# Patient Record
Sex: Male | Born: 1945 | Race: White | Hispanic: No | Smoking: Former smoker
Health system: Southern US, Community
[De-identification: ages and names within clinical notes are randomized; demographics above are authoritative.]

## PROBLEM LIST (undated history)

## (undated) DIAGNOSIS — M1990 Unspecified osteoarthritis, unspecified site: Secondary | ICD-10-CM

## (undated) DIAGNOSIS — K922 Gastrointestinal hemorrhage, unspecified: Secondary | ICD-10-CM

## (undated) DIAGNOSIS — K279 Peptic ulcer, site unspecified, unspecified as acute or chronic, without hemorrhage or perforation: Secondary | ICD-10-CM

## (undated) DIAGNOSIS — R0602 Shortness of breath: Secondary | ICD-10-CM

## (undated) DIAGNOSIS — R600 Localized edema: Secondary | ICD-10-CM

## (undated) DIAGNOSIS — I2510 Atherosclerotic heart disease of native coronary artery without angina pectoris: Secondary | ICD-10-CM

## (undated) DIAGNOSIS — I10 Essential (primary) hypertension: Secondary | ICD-10-CM

## (undated) DIAGNOSIS — G8929 Other chronic pain: Secondary | ICD-10-CM

## (undated) DIAGNOSIS — N179 Acute kidney failure, unspecified: Secondary | ICD-10-CM

## (undated) DIAGNOSIS — Z8719 Personal history of other diseases of the digestive system: Secondary | ICD-10-CM

## (undated) DIAGNOSIS — E785 Hyperlipidemia, unspecified: Secondary | ICD-10-CM

## (undated) HISTORY — DX: Acute kidney failure, unspecified: N17.9

## (undated) HISTORY — PX: OTHER SURGICAL HISTORY: SHX169

## (undated) HISTORY — DX: Atherosclerotic heart disease of native coronary artery without angina pectoris: I25.10

## (undated) HISTORY — PX: FOOT SURGERY: SHX648

---

## 1996-09-01 HISTORY — PX: CORONARY ARTERY BYPASS GRAFT: SHX141

## 2005-01-14 ENCOUNTER — Encounter
Admission: RE | Admit: 2005-01-14 | Discharge: 2005-04-14 | Payer: Self-pay | Admitting: Physical Medicine & Rehabilitation

## 2005-01-15 ENCOUNTER — Ambulatory Visit: Payer: Self-pay | Admitting: Physical Medicine & Rehabilitation

## 2005-02-27 ENCOUNTER — Ambulatory Visit: Payer: Self-pay | Admitting: Physical Medicine & Rehabilitation

## 2005-05-23 ENCOUNTER — Ambulatory Visit: Payer: Self-pay | Admitting: Physical Medicine & Rehabilitation

## 2005-05-23 ENCOUNTER — Encounter
Admission: RE | Admit: 2005-05-23 | Discharge: 2005-08-21 | Payer: Self-pay | Admitting: Physical Medicine & Rehabilitation

## 2005-08-19 ENCOUNTER — Ambulatory Visit: Payer: Self-pay | Admitting: Physical Medicine & Rehabilitation

## 2005-08-19 ENCOUNTER — Encounter
Admission: RE | Admit: 2005-08-19 | Discharge: 2005-11-17 | Payer: Self-pay | Admitting: Physical Medicine & Rehabilitation

## 2005-12-16 ENCOUNTER — Encounter
Admission: RE | Admit: 2005-12-16 | Discharge: 2006-03-16 | Payer: Self-pay | Admitting: Physical Medicine & Rehabilitation

## 2005-12-16 ENCOUNTER — Ambulatory Visit: Payer: Self-pay | Admitting: Physical Medicine & Rehabilitation

## 2006-06-10 ENCOUNTER — Encounter
Admission: RE | Admit: 2006-06-10 | Discharge: 2006-09-08 | Payer: Self-pay | Admitting: Physical Medicine & Rehabilitation

## 2006-06-10 ENCOUNTER — Ambulatory Visit: Payer: Self-pay | Admitting: Physical Medicine & Rehabilitation

## 2010-04-02 ENCOUNTER — Encounter: Payer: Self-pay | Admitting: Cardiology

## 2010-04-06 ENCOUNTER — Observation Stay (HOSPITAL_COMMUNITY): Admission: EM | Admit: 2010-04-06 | Discharge: 2010-04-07 | Payer: Self-pay | Admitting: Cardiology

## 2010-04-06 ENCOUNTER — Encounter: Payer: Self-pay | Admitting: Cardiology

## 2010-04-06 ENCOUNTER — Ambulatory Visit: Payer: Self-pay | Admitting: Cardiovascular Disease

## 2010-04-07 ENCOUNTER — Encounter: Payer: Self-pay | Admitting: Cardiology

## 2010-04-09 ENCOUNTER — Encounter: Payer: Self-pay | Admitting: Cardiology

## 2010-04-09 DIAGNOSIS — R0602 Shortness of breath: Secondary | ICD-10-CM

## 2010-05-01 ENCOUNTER — Telehealth (INDEPENDENT_AMBULATORY_CARE_PROVIDER_SITE_OTHER): Payer: Self-pay | Admitting: *Deleted

## 2010-05-02 ENCOUNTER — Encounter: Payer: Self-pay | Admitting: Cardiology

## 2010-07-04 ENCOUNTER — Encounter: Payer: Self-pay | Admitting: Physician Assistant

## 2010-07-04 DIAGNOSIS — F411 Generalized anxiety disorder: Secondary | ICD-10-CM | POA: Insufficient documentation

## 2010-07-04 DIAGNOSIS — R072 Precordial pain: Secondary | ICD-10-CM

## 2010-07-04 DIAGNOSIS — Z951 Presence of aortocoronary bypass graft: Secondary | ICD-10-CM

## 2010-07-04 DIAGNOSIS — M79609 Pain in unspecified limb: Secondary | ICD-10-CM

## 2010-10-01 NOTE — Miscellaneous (Signed)
Summary: Orders Update  Clinical Lists Changes  Problems: Added new problem of CHEST PAIN, PRECORDIAL (ICD-786.51) Added new problem of SHORTNESS OF BREATH (ICD-786.05) Orders: Added new Referral order of Nuclear Med (Nuc Med) - Signed

## 2010-10-01 NOTE — Letter (Signed)
Summary: Appointment -missed  [facility] [address]   [place], [place] [address]   Phone: [phone]  Fax: [phone]     July 04, 2010 MRN: [id number]     Jay Mclean [address] [place], [place]  [address]     Dear Mr. BASILIO,  Our records indicate you missed your appointment on July 04, 2010                        with Gene Dedric Ethington.   It is very important that we reach you to reschedule this appointment. We look forward to participating in your health care needs.   Please contact us at the number listed above at your earliest convenience to reschedule this appointment.   Sincerely,    Glass blower/designer

## 2010-10-01 NOTE — Progress Notes (Signed)
Summary: PHONE: APPT VERIF.  Phone Note Outgoing Call Call back at [facility] Phone [phone] Call back at CALLED PATIENT   Call placed by: Zachary George,  May 01, 2010 3:42 PM Reason for Call: Confirm/change Appt Details for Reason: verify appt /stress test Summary of Call: called Jay Mclean to verify appt for 05-02-2010 and to see why patient did not go for his stress test that was ordered. States noone told him. Please see all documentation in EMR. I spoke directly with the patient on the phone and verified with him the date and time of his test. I also mailed a copy of the order along with the instructions of his test.  Initial call taken by: Zachary George,  May 01, 2010 3:44 PM

## 2010-10-01 NOTE — Letter (Signed)
Summary: Appointment -missed  [facility] at [facility] [address]   [place], [place] [address]   Phone: [phone]  Fax: [phone]     May 02, 2010 MRN: [id number]     TRYSON LUMLEY [address] [place], [place]  [address]     Dear Mr. LANDRIGAN,  Our records indicate you missed your appointment on May 02, 2010                        with Dr.  Antoine Poche.   It is very important that we reach you to reschedule this appointment. We look forward to participating in your health care needs.   Please contact us at the number listed above at your earliest convenience to reschedule this appointment.   Sincerely,    Glass blower/designer

## 2010-10-01 NOTE — Miscellaneous (Signed)
Summary: EXERCISE MYOVIEW  Clinical Lists Changes  Orders: Added new Referral order of Nuclear Med (Nuc Med) - Signed

## 2010-11-15 LAB — CBC
MCHC: 33.9 g/dL (ref 30.0–36.0)
MCV: 98.6 fL (ref 78.0–100.0)
RDW: 14.7 % (ref 11.5–15.5)
WBC: 6 10*3/uL (ref 4.0–10.5)

## 2010-11-15 LAB — CK TOTAL AND CKMB (NOT AT ARMC)
CK, MB: 0.6 ng/mL (ref 0.3–4.0)
CK, MB: 0.8 ng/mL (ref 0.3–4.0)
Relative Index: INVALID (ref 0.0–2.5)
Total CK: 50 U/L (ref 7–232)
Total CK: 51 U/L (ref 7–232)
Total CK: 53 U/L (ref 7–232)

## 2010-11-15 LAB — COMPREHENSIVE METABOLIC PANEL
AST: 32 U/L (ref 0–37)
BUN: 7 mg/dL (ref 6–23)
Chloride: 110 mEq/L (ref 96–112)
Creatinine, Ser: 1.12 mg/dL (ref 0.4–1.5)
GFR calc Af Amer: >60 mL/min (ref >60)
GFR calc non Af Amer: >60 mL/min (ref >60)
Total Bilirubin: 1.1 mg/dL (ref 0.3–1.2)
Total Protein: 7.1 g/dL (ref 6.0–8.3)

## 2010-11-15 LAB — DIFFERENTIAL
Basophils Relative: 1 % (ref 0–1)
Eosinophils Relative: 4 % (ref 0–5)
Lymphocytes Relative: 32 % (ref 12–46)
Monocytes Absolute: 0.4 10*3/uL (ref 0.1–1.0)

## 2010-11-15 LAB — PROTIME-INR
INR: 1.23 (ref 0.00–1.49)
Prothrombin Time: 15.7 seconds — ABNORMAL HIGH (ref 11.6–15.2)

## 2010-11-15 LAB — LIPID PANEL
HDL: 45 mg/dL (ref >39)
Total CHOL/HDL Ratio: 3.3 RATIO

## 2010-11-15 LAB — CARDIAC PANEL(CRET KIN+CKTOT+MB+TROPI): Total CK: 51 U/L (ref 7–232)

## 2010-11-15 LAB — TROPONIN I: Troponin I: <0.01 ng/mL (ref 0.00–0.06)

## 2011-01-17 NOTE — Assessment & Plan Note (Signed)
Mr. Seddon returns today for followup evaluation.  He reported that he  has developed some joint pain, specifically of his knees and elbows.  He has  gone to the emergency room twice now.  The first was on May 06, 2006  and he was prescribed Naproxen and hydrocodone.  We had been already giving  him tramadol and that had been helping him with his pain which I was seeing  him for initially.   The patient went back June 10, 2006 for left elbow pain.  He was told he  has gout and was prescribed colchicine 0.6 mg.  That was started just  yesterday.  He has taken approximately 3-4 tablets and developed severe  nausea along with cold sweats and hoarsness.  He is not continuing to take  that medication.  In the meantime the patient has tried some Darvocet that  his wife had available.  He reports that he takes approximately 4 tablets  per day and that helps all of his joint pain.  He would like to use the  Darvocet in place of his tramadol and possibly use a different medicine for  his gout if one is available.   MEDICATION:  1. Tramadol (not using at present).  2. Darvocet (wife's medicine).   REVIEW OF SYSTEMS:  Noncontributory.   PHYSICAL EXAMINATION:  Well-appearing, moderately overweight adult male who  is pleasant, cooperative.  Blood pressure is 154/75 with pulse 62, respirations 16, and O2 saturation  96% on room air.  His is 5/5 strength bilateral upper and lower extremities.  __________  normal.  Reflexes are 2+ and symmetrical.   IMPRESSION:  1. 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  2. Spontaneous onset of bilateral knee and bilateral elbow pain secondary      to gout/arthritis.  3. In the office today we did have the patient discontinue his tramadol.      In place we started him on Darvocet N 100, 1 to 2 tablets p.o. b.i.d.      with a total of 120 were prescribed.  4. In place of his colchicine we are going to try allopurinol at  100 mg      daily.  I told him to try that medicine and see if it also causes      nausea as the colchicine does.  I would expect that he would tolerate      the allopurinol better than he did the colchicine.  In the meantime we      will plan on seeing the patient in followup in approximately 3 months      time.           ______________________________  Ellwood Dense, M.D.     DC/MedQ  D:  06/11/2006 09:21:25  T:  06/12/2006 14:59:07  Job #:  [id number]

## 2011-01-17 NOTE — Assessment & Plan Note (Signed)
SUBJECTIVE:  [name] returns to the clinic for follow up evaluation.  He  was first seen in this office Jan 15, 2005, for referral from [facility] Department for treatment of bilateral calcaneal pain.   During the last clinic visit, we had this patient start Tramadol 50 mg one  tablet p.o. q.i.d.  He reports that that is helping substantially with the  bilateral calcaneal pain.  He reports that he is now pain free for the first  time in 25 years.  He reports that he can even go off stairs pushing off on  his toes and has no heel pain whatsoever.  He also has returned to playing  golf and does a fair amount of yard work around his home on a regular basis.  He does report slight constipation with the Tramadol, but has used prune  juice and that problem is resolved.  He does need a refill on his Tramadol  in the office today.   MEDICATIONS:  1.  Tramadol 50 mg one tablet p.o. q.i.d.  2.  Lipitor 20 mg p.o. q.h.s.   OBJECTIVE:  GENERAL APPEARANCE:  Well-appearing, moderately overweight adult  male in no acute distress.  VITAL SIGNS:  Blood pressure and vitals were not obtained in the office  today.  EXTREMITIES:  The patient has no complaints whatsoever of his calcaneal  areas bilaterally.  He has 5/5 strength throughout the bilateral lower  extremities.   ASSESSMENT:  Chronic bilateral calcaneal pain after prior fractures in 1980.   PLAN:  The patient has had an excellent response to Tramadol.  We have  refilled his Tramadol at 50 mg one tablet p.o. q.i.d. p.r.n. with three  refills.  We will plan on seeing the patient in follow up in this office in  approximately three months time with refills to his medication prior to that  appointment if necessary.       DC/MedQ  D:  02/28/2005 10:41:23  T:  02/28/2005 11:13:11  Job #:  [id number]

## 2011-01-17 NOTE — Group Therapy Note (Signed)
MEDICAL RECORD NUMBER:  [id number].   REFERRAL:  [facility] Department.   PURPOSE OF EVALUATION:  Evaluate and treat bilateral calcaneal pain.   HISTORY OF PRESENT ILLNESS:  Jay Mclean is a 65 year old adult male  referred to this hospital by [facility] Department for chronic  calcaneal pain.   Jay Mclean has a history of calcaneal fractures bilaterally when he fell  off a roof in 1980. He reports that a pin was placed on his right ankle in  Jay calcaneal region and that was then casted. A cast was also placed on Jay  left side but no pin was placed.   Jay Mclean reports that subsequent to this Jay casts were removed, but he  had persistent bilateral heel pain through Jay 1980s.   Jay Mclean reports that was tolerable at that time, and he was using Advil  and aspirin. He reports that Jay pain eventually became unbearable in Jay  early 1990s, and he say Dr. Shona Simpson. He subsequently underwent left  subtalar fusion in 1994.   Postoperatively, Jay Mclean reports that his left heel pain still stayed  Jay same. He reports that his right heel pain was unchanged and severe. Jay  Mclean reports that he at one time was given OxyContin, but that medication  was too strong, and it caused some disorientation. He subsequently saw Dr.  Shona Simpson in 1995, and they suggested retirement plan. He was told to limit  activity at that time.   Jay Mclean has seen other orthopedists who have tried shoe inserts along  with over-Jay-counter pain medications. These pain medicines gave him  reasonably relief, but he then developed upper GI bleed in November of 2004  secondary to use of aspirin and anti-inflammatory medication. He reports  that he did use Darvocet for a very short period of time after his bypass  surgery, and that did help him to some small degree. He has used Tylenol up  to 1,500 mg three times a day, but that along with his cholesterol  medication was  felt to be dangerous in terms of his liver function, and Jay  Tylenol has subsequently been discontinued.   Jay Mclean reports that he wants to be active as possible including  golfing, using a motorized cart, and also doing some woodwork in his garage.  He reports that Jay pain has been present and that Jay only way he gets  relief is if he rests. He tries to limit his activity, and his pain level is  a 3 or 4. If he is standing for two hours or more, then he usually has  increased pain for Jay next day or so. He does not get much relief with any  of Jay medications that he is using to date. In fact, he is on no pain  medications at this point.   PAST MEDICAL HISTORY:  1.  Hyperlipidemia.  2.  Cardiac bypass in 1998.  3.  Upper GI bleed November 2004 secondary to NSAIDs and aspirin.  4.  Hypertension.  5.  History of bilateral calcaneal fractures as above.  6.  Stomach hernia surgery in Jay past.   ALLERGIES:  Intolerance of NSAIDS secondary to GI bleed.   SOCIAL HISTORY:  Jay Mclean is married with three grown children. He is  retired on SSI at Jay present time. He denies alcohol or tobacco usage. He  golfs and does woodworking along with playing with a radio-controlled plane  at Jay present time.  REVIEW OF SYSTEMS:  Positive for night sweats, limb swelling, and shortness  of breath.   MEDICATIONS:  1.  Lipitor 40 mg 1/2 tablet q.d.  2.  Benazepril 10 mg 1 tablet q.d.   PHYSICAL EXAMINATION:  Reasonably well appearing, slightly overweight adult  male in no acute discomfort. Blood pressure 121/69 with a pulse of 94,  respiratory rate 16, and O2 saturation 96% on room air. He ambulates without  any assistive device. Upper extremity and lower extremity strength was 5/5  throughout. Bulk and tone were normal, and reflexes were 2+ and symmetrical.  Sensation was intact to light touch throughout Jay bilateral upper and lower  extremities.   Examination of his bilateral heels  showed that he had no pain with pressure  applied by myself. He has well healed scar present in Jay left lower  extremity from Jay prior subtalar fusion. Skin was intact with no lesions  noted. He has decreased range of motion of Jay bilateral ankles especially  on Jay left. Ankle dorsi flexion was 4+/5 bilaterally.   IMPRESSION:  Chronic bilateral calcaneal pain after prior fractures in Jay  1980s.   At Jay present time, we have evaluated Jay Mclean and found that he  complains of bilateral calcaneal pain for at least 15 to 16 years duration.  Jay subtalar fusion performed in 1994 was unsuccessful in relieving much of  his ankle or heel pain on Jay left side. He has tried on OxyContin in Jay  past, but that medication was too strong. He has been unable to tolerate Jay  NSAIDs or Tylenol secondary fear of liver function abnormalities along with  Jay upper GI bleed noted.   At this point, we will try Ultram (tramadol) at 50 mg 1 tablet p.o. q.i.d.  p.r.n. I have given him a prescription for 60 in Jay office today so that we  do not waste medication if he is unable to tolerate Jay medication or does  not get adequate relief. He will continue to monitor and modify his activity  to decrease Jay onset of pain which occasionally occurs after standing or  weight bearing for two hours or so. We will plan on seeing Jay Mclean in  followup in approximately one month's time. We may still consider use of  Darvocet which he has tolerated in Jay past. We will have to still consider  Jay potential over use of Jay Darvocet with Jay Tylenol present in this  combination medication.   We will plan on seeing Jay Mclean in followup in approximately one month's  time.      DC/MedQ  D:  01/15/2005 14:39:25  T:  01/15/2005 15:17:18  Job #:  [id number]

## 2011-01-17 NOTE — Assessment & Plan Note (Signed)
DATE OF EVALUATION:  December 17, 2005.   SUBJECTIVE:  [name] returns to clinic today for follow up evaluation.  He has good news all around. He has lost a total of 50 pounds and still  plans to get down to 185 pounds.  He has been able to come off his blood  pressure and cholesterol medications, specifically his Benazepril and  Lipitor secondary to the weight loss resulting from improved pain control.  He does take his Tramidol 50 mg q.i.d. and reports that it gives him  excellent relief and he has only minimal pain in his heels.  He continues to  be followed by Dr. Georgann Housekeeper, his primary care physician.  He continues  to be able to play 18 holes of golf without using a cart.  He also is able  to walk on a treadmill regularly.   MEDICATIONS:  Tramidol 50 mg one tablet q.i.d. p.r.n. (4 per day).   REVIEW OF SYSTEMS:  Positive for limb swelling periodically.   PHYSICAL EXAMINATION:  GENERAL APPEARANCE:  Well-appearing, casually dressed  adult male in no acute distress.  VITAL SIGNS:  Blood pressure 132/75 with pulse 79, respiratory rate 16 and  oxygen saturation 97% on room air.  MUSCULOSKELETAL:  He has 5/5 strength throughout the bilateral upper and  lower extremities.  Bulk and tone were normal.  Reflexes were 2+ and  symmetrical.   IMPRESSION:  Chronic bilateral calcaneal pain after prior fractures in the  1980's.   PLAN:  We refilled the patient's Tramidol just recently and he still has  refills on that prescription.  He is doing extremely well overall and  continues to improve his health substantially.  He is only taking Tramidol  and has been able to come off blood pressure and cholesterol medications.  His weight continues to improve as he becomes more active.  Will plan on  seeing the patient in follow up in this office in approximately six months  time with refill of medications prior to that appointment as necessary.           ______________________________  Ellwood Dense, M.D.     DC/MedQ  D:  12/17/2005 14:14:41  T:  12/18/2005 09:59:31  Job #:  [id number]

## 2011-01-17 NOTE — Assessment & Plan Note (Signed)
MEDICAL RECORD NUMBER:  [id number].   [name] returns to clinic today for followup evaluation. I saw him in  this office February 28, 2005. We have been treating him with tramadol 50 mg 4  times per day for a bilateral calcaneal pain. He had history of prior  fractures in the 1980s. He reports that he is getting good results with the  q.i.d. tramadol at the present time. He reports that he has no pain if he  walks 9 holes of golf but has only temporary pain if he walks 18 holes of  golf. 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. He reports  that with the increased pain relief he has been able to walk more and has  actually lost approximately 40 pounds such that he does not need to use his  cholesterol or blood pressure medicines. He is using only tramadol at this  time.   MEDICATIONS:  Tramadol 50 mg 1 tablet q.i.d. p.r.n. (4 per day).   REVIEW OF SYSTEMS:  Positive for weight loss intentionally, limb swelling,  and poor appetite.   PHYSICAL EXAMINATION:  Well-appearing, pleasant adult male in no acute  discomfort. Blood pressure 137/67 with a pulse of 74, respiratory rate 16,  and O2 saturation 97% on room air. He has 5-/5 strength throughout the  bilateral upper and lower extremities. Bulk and tone were normal, and  reflexes were 2+ and symmetrical.   IMPRESSION:  Chronic bilateral calcaneal pain after prior fractures in 1980.   In this office today, no refill on his tramadol is necessary. He has one  refill remaining from his prior prescription. He is using the tramadol 4  times per day without adverse side effects or complications. We will plan on  seeing the patient in followup in this office in approximately three months'  time with refills of medications prior to that appointment as necessary.           ______________________________  Ellwood Dense, M.D.     DC/MedQ  D:  05/26/2005 09:43:50  T:  05/27/2005 54:09:81  Job #:  [id number]

## 2011-01-17 NOTE — Assessment & Plan Note (Signed)
MEDICAL RECORD NUMBER:  [id number].   Mr. Zimbelman returns to the clinic today for followup evaluation. He is  thrilled with the improvement in his pain level that has persisted with the  use of the tramadol at 50 mg 4 times per day. He has been able to loose 50  pounds over the last several months and also has been able to stop  cholesterol and blood pressure medicines. His blood pressure is within  normal limits in the office today. He reports that he occasionally has a  swollen left ankle, but that is after prolonged standing or walking. He is  trying to play golf and walking on a daily basis. His goal is to loose  another 30 pounds.   The patient did have recent x-rays which were reportedly okay with no sign  of gout. He does not need a refill on his tramadol in the office today.   MEDICATIONS:  Tramadol 50 mg 1 tablet q.i.d. p.r.n. (4 per day).   REVIEW OF SYSTEMS:  Positive for limb swelling in the left lower extremity.   PHYSICAL EXAMINATION:  Well appearing, casually dressed adult male in no  acute discomfort. Blood pressure 127/67 with a pulse of 77, respiratory rate  16, and O2 saturation 98% on room air. He has 5-/5 strength throughout the  bilateral upper and lower extremities. Bulk and tone were normal, and  reflexes were 2+ and symmetrical. Sensation was intact to light touch  throughout.   IMPRESSION:  Chronic bilateral calcaneal pain after prior fractures in the  1980s.   No refill on his tramadol is necessary. He has made excellent progress, and  his pain relief is such that he is able to walk on a daily basis and has  lost 50 pounds. He has still has goal of another 30 pounds to loose. He is  doing well overall with steady improvement in his health as evidenced by the  discontinuation of his cholesterol and blood pressure medicines. He reports  that his recent total cholesterol was measured at 127.   We will plan on seeing the patient in followup in this office  in  approximately four months' time with refill of medications prior to that  appointment as necessary.           ______________________________  Ellwood Dense, M.D.     DC/MedQ  D:  08/20/2005 15:56:40  T:  08/22/2005 09:42:15  Job #:  [id number]

## 2012-04-12 ENCOUNTER — Encounter (HOSPITAL_COMMUNITY): Payer: Self-pay | Admitting: Emergency Medicine

## 2012-04-12 ENCOUNTER — Emergency Department (HOSPITAL_COMMUNITY): Payer: Medicare Other

## 2012-04-12 ENCOUNTER — Emergency Department (HOSPITAL_COMMUNITY)
Admission: EM | Admit: 2012-04-12 | Discharge: 2012-04-12 | Disposition: A | Discharge status: Home / Self Care | Payer: Medicare Other | Attending: Emergency Medicine | Admitting: Emergency Medicine

## 2012-04-12 DIAGNOSIS — W19XXXA Unspecified fall, initial encounter: Secondary | ICD-10-CM | POA: Insufficient documentation

## 2012-04-12 DIAGNOSIS — I10 Essential (primary) hypertension: Secondary | ICD-10-CM | POA: Insufficient documentation

## 2012-04-12 DIAGNOSIS — M545 Low back pain: Secondary | ICD-10-CM

## 2012-04-12 DIAGNOSIS — I252 Old myocardial infarction: Secondary | ICD-10-CM | POA: Insufficient documentation

## 2012-04-12 DIAGNOSIS — Y9301 Activity, walking, marching and hiking: Secondary | ICD-10-CM | POA: Insufficient documentation

## 2012-04-12 DIAGNOSIS — S32000A Wedge compression fracture of unspecified lumbar vertebra, initial encounter for closed fracture: Secondary | ICD-10-CM

## 2012-04-12 DIAGNOSIS — S32009A Unspecified fracture of unspecified lumbar vertebra, initial encounter for closed fracture: Secondary | ICD-10-CM | POA: Insufficient documentation

## 2012-04-12 DIAGNOSIS — R6889 Other general symptoms and signs: Secondary | ICD-10-CM | POA: Insufficient documentation

## 2012-04-12 DIAGNOSIS — Y998 Other external cause status: Secondary | ICD-10-CM

## 2012-04-12 HISTORY — DX: Essential (primary) hypertension: I10

## 2012-04-12 MED ORDER — OXYCODONE-ACETAMINOPHEN 5-325 MG PO TABS: 1.0000 | ORAL_TABLET | Freq: Four times a day (QID) | ORAL | Status: AC | PRN

## 2012-04-12 MED ORDER — OXYCODONE-ACETAMINOPHEN 5-325 MG PO TABS
2.0000 | ORAL_TABLET | Freq: Once | ORAL | Status: AC
  Administered 2012-04-12: 2 via ORAL
  Filled 2012-04-12: qty 2

## 2012-04-12 NOTE — ED Provider Notes (Signed)
History     CSN: [id number]  Arrival date & time 04/12/12  0350   First MD Initiated Contact with Patient 04/12/12 0351      Chief Complaint  Patient presents with  . Fall  . Back Pain     The history is provided by the patient.  patient reports he was watching TV this evening and fell asleep on the couch.  When he went to get up to go to bed he reports falling and landing on his bottom.  He reports severe pain in his low back at this time.  He denies weakness or numbness of his lower extremities.  His no chest pain or abdominal pain.  Said no recent fevers or chills.  He denies dysuria or urinary frequency.  He has no bowel or bladder complaints.  His pain is worse with movement and palpation of his low back.  Nothing improves his pain.  His pain is constant and moderate in severity. He has no history of cancer.  He denies chest pain or shortness of breath.  Past Medical History  Diagnosis Date  . MI (myocardial infarction)   . Hypertension     Past Surgical History  Procedure Date  . Foot surgery   . Coronary artery bypass graft 1998    x3    History reviewed. No pertinent family history.  History  Substance Use Topics  . Smoking status: Never Smoker   . Smokeless tobacco: Not on file  . Alcohol Use: No      Review of Systems  All other systems reviewed and are negative.    Allergies  Aspirin and Ibuprofen  Home Medications   Current Outpatient Rx  Name Route Sig Dispense Refill  . ALPRAZOLAM 1 MG PO TABS Oral Take 1 mg by mouth 4 (four) times daily as needed.    . OXYCODONE-ACETAMINOPHEN 5-325 MG PO TABS Oral Take 1 tablet by mouth every 6 (six) hours as needed for pain. 15 tablet 0    BP 137/62  Pulse 81  Temp 98.1 F (36.7 C) (Oral)  Resp 18  Ht 5\' 8"  (1.727 m)  Wt 152 lb (68.947 kg)  BMI 23.11 kg/m2  SpO2 98%  Physical Exam  Nursing note and vitals reviewed. Constitutional: He is oriented to person, place, and time. He appears well-developed  and well-nourished.  HENT:  Head: Normocephalic and atraumatic.  Eyes: EOM are normal.  Neck: Normal range of motion.       No cervical spine tenderness.  C-spine cleared by Nexus criteria.  Cardiovascular: Normal rate, regular rhythm, normal heart sounds and intact distal pulses.   Pulmonary/Chest: Effort normal and breath sounds normal. No respiratory distress.  Abdominal: Soft. He exhibits no distension. There is no tenderness.  Musculoskeletal: Normal range of motion.       The thoracic tenderness.  Mild upper lumbar spinal tenderness without step-offs.  5 out of 5 strength in bilateral upper lower extremity major muscle groups  Neurological: He is alert and oriented to person, place, and time.  Skin: Skin is warm and dry.  Psychiatric: He has a normal mood and affect. Judgment normal.    ED Course  Procedures (including critical care time)  Labs Reviewed - No data to display Dg Lumbar Spine Complete  04/12/2012  *RADIOLOGY REPORT*  Clinical Data: Back pain.  LUMBAR SPINE - COMPLETE 4+ VIEW  Comparison: 05/14/2010.  Findings: There is a new L1 compression fracture with about 25% loss of vertebral body height.  Unchanged compression fracture of T12.  L2 compression fracture appears similar to prior.  The L3-L5 levels are unchanged compared to prior.  Aortoiliac atherosclerosis. Cholecystectomy clips are present in the right upper quadrant.  Mild dextroconvex curvature with the apex at L1. No spondylolisthesis.  Suboptimal oblique views.  No gross pars defects identified.  IMPRESSION:  1.  Interval L1 superior endplate compression fracture, possibly acute or subacute with about 25% loss of anterior vertebral body height; no radiographic evidence of retropulsion. 2.  Chronic L2 superior endplate compression fracture. Chronic T12 and T11 compression fractures.  Original Report Authenticated By: Andreas Newport, M.D.    I personally reviewed the imaging tests through PACS system  I reviewed  available ER/hospitalization records thought the EMR   1. Lumbar compression fracture   2. Low back pain       MDM  The patient has normal strength in his lower extremities.  Discharge home in good condition.  I suspect this is low back pain associated with a new L1 compression fracture.  Home with pain medicine and PCP followup        Lyanne Co, MD 04/12/12 [phone]

## 2012-04-12 NOTE — ED Notes (Addendum)
Pt got up off the couch tonight and experienced a fall onto hardwood floor, landed on his left bottock and is now complaining of lower back. No obvious neuro deficits to the lower extremities. Pt on backboard at this time. States pain with ROM of back

## 2012-04-28 ENCOUNTER — Other Ambulatory Visit: Payer: Self-pay | Admitting: Family Medicine

## 2012-04-28 DIAGNOSIS — T148XXA Other injury of unspecified body region, initial encounter: Secondary | ICD-10-CM

## 2012-04-29 ENCOUNTER — Ambulatory Visit (HOSPITAL_COMMUNITY)
Admission: RE | Admit: 2012-04-29 | Discharge: 2012-04-29 | Disposition: A | Discharge status: Home / Self Care | Payer: Medicare Other | Source: Ambulatory Visit | Attending: Family Medicine | Admitting: Family Medicine

## 2012-04-29 ENCOUNTER — Other Ambulatory Visit: Payer: Self-pay | Admitting: Family Medicine

## 2012-04-29 DIAGNOSIS — IMO0002 Reserved for concepts with insufficient information to code with codable children: Secondary | ICD-10-CM

## 2012-04-29 DIAGNOSIS — M545 Low back pain, unspecified: Secondary | ICD-10-CM | POA: Insufficient documentation

## 2012-04-29 DIAGNOSIS — M5124 Other intervertebral disc displacement, thoracic region: Secondary | ICD-10-CM | POA: Insufficient documentation

## 2012-04-29 DIAGNOSIS — M5126 Other intervertebral disc displacement, lumbar region: Secondary | ICD-10-CM | POA: Insufficient documentation

## 2012-04-29 DIAGNOSIS — T148XXA Other injury of unspecified body region, initial encounter: Secondary | ICD-10-CM

## 2012-04-29 DIAGNOSIS — W19XXXA Unspecified fall, initial encounter: Secondary | ICD-10-CM | POA: Insufficient documentation

## 2012-05-04 ENCOUNTER — Telehealth (HOSPITAL_COMMUNITY): Payer: Self-pay

## 2012-05-04 NOTE — Telephone Encounter (Signed)
I spoke with Jay Mclean in regards to his apt.  I gave him the apt time and date for the consult.  He had a good understanding.

## 2012-05-04 NOTE — Telephone Encounter (Signed)
I left another message for debbie in regards to MR. Reno's apt today.

## 2012-05-05 ENCOUNTER — Ambulatory Visit (HOSPITAL_COMMUNITY): Payer: Medicare Other | Attending: Family Medicine

## 2012-05-05 ENCOUNTER — Telehealth (HOSPITAL_COMMUNITY): Payer: Self-pay

## 2012-05-05 ENCOUNTER — Other Ambulatory Visit (HOSPITAL_COMMUNITY): Payer: Self-pay

## 2012-05-05 NOTE — Telephone Encounter (Signed)
I called the pt.  He stated that it must have slipped his mind.  He also stated that he would call me back to reschedule.

## 2012-05-20 ENCOUNTER — Other Ambulatory Visit: Payer: Self-pay | Admitting: Family Medicine

## 2012-05-20 DIAGNOSIS — IMO0002 Reserved for concepts with insufficient information to code with codable children: Secondary | ICD-10-CM

## 2012-05-27 ENCOUNTER — Ambulatory Visit (HOSPITAL_COMMUNITY): Admission: RE | Admit: 2012-05-27 | Payer: Medicare Other | Source: Ambulatory Visit

## 2012-05-27 ENCOUNTER — Telehealth (HOSPITAL_COMMUNITY): Payer: Self-pay

## 2012-05-27 NOTE — Telephone Encounter (Signed)
[name] did not show for his apt today.  I am calling Jay Mclean to make her aware

## 2012-10-30 ENCOUNTER — Encounter: Payer: Self-pay | Admitting: Cardiovascular Disease

## 2012-10-30 ENCOUNTER — Encounter: Payer: Self-pay | Admitting: Physician Assistant

## 2012-11-16 ENCOUNTER — Other Ambulatory Visit: Payer: Self-pay | Admitting: Physician Assistant

## 2012-11-16 DIAGNOSIS — R195 Other fecal abnormalities: Secondary | ICD-10-CM

## 2012-11-17 NOTE — Addendum Note (Signed)
Addended by: Gwenith Daily on: 11/17/2012 11:45 AM   Modules accepted: Orders

## 2012-11-24 ENCOUNTER — Telehealth: Payer: Self-pay

## 2012-11-24 NOTE — Telephone Encounter (Signed)
Message received.

## 2012-11-24 NOTE — Telephone Encounter (Signed)
Received letter from Dr. Starr Lake office that patient declined appt for heme positive stools

## 2012-12-07 ENCOUNTER — Telehealth: Payer: Self-pay | Admitting: Physician Assistant

## 2012-12-07 ENCOUNTER — Encounter: Payer: Self-pay | Admitting: Cardiovascular Disease

## 2012-12-07 NOTE — Telephone Encounter (Signed)
APPT MADE

## 2012-12-08 ENCOUNTER — Encounter: Payer: Self-pay | Admitting: General Practice

## 2012-12-08 ENCOUNTER — Ambulatory Visit (INDEPENDENT_AMBULATORY_CARE_PROVIDER_SITE_OTHER): Payer: Medicare Other | Admitting: General Practice

## 2012-12-08 VITALS — BP 143/72 | HR 86 | Temp 97.0°F | Ht 68.0 in | Wt 234.0 lb

## 2012-12-08 DIAGNOSIS — J329 Chronic sinusitis, unspecified: Secondary | ICD-10-CM

## 2012-12-08 MED ORDER — AMOXICILLIN 500 MG PO CAPS: 500.0000 mg | ORAL_CAPSULE | Freq: Two times a day (BID) | ORAL | Status: AC

## 2012-12-08 NOTE — Patient Instructions (Signed)

## 2012-12-08 NOTE — Progress Notes (Signed)
  Subjective:    Patient ID: Jay Mclean, male    DOB: [birth year], 67 y.o.   MRN: [id number]  Sinusitis This is a new problem. The current episode started 1 to 4 weeks ago. The problem has been gradually worsening since onset. There has been no fever. His pain is at a severity of 4/10. Associated symptoms include congestion and sinus pressure. Pertinent negatives include no chills, ear pain, headaches or shortness of breath. Past treatments include oral decongestants. The treatment provided no relief.      Review of Systems  Constitutional: Negative for fever and chills.  HENT: Positive for congestion and sinus pressure. Negative for ear pain.   Eyes: Negative for pain, discharge and itching.  Respiratory: Negative for chest tightness and shortness of breath.   Cardiovascular: Negative for chest pain and palpitations.  Gastrointestinal: Negative for abdominal pain and blood in stool.  Genitourinary: Negative for difficulty urinating.  Skin: Negative for rash.  Neurological: Negative for dizziness and headaches.  Psychiatric/Behavioral: Negative.        Objective:   Physical Exam  Constitutional: He is oriented to person, place, and time. He appears well-developed and well-nourished.  HENT:  Head: Normocephalic and atraumatic.  Right Ear: External ear normal.  Left Ear: External ear normal.  Nose: Right sinus exhibits frontal sinus tenderness. Left sinus exhibits frontal sinus tenderness.  Pulmonary/Chest: Effort normal and breath sounds normal. No respiratory distress. He exhibits no tenderness.  Neurological: He is alert and oriented to person, place, and time.  Skin: Skin is warm.  Psychiatric: He has a normal mood and affect.          Assessment & Plan:  Continue antibiotics even if feeling better Continue current medications Proper handwashing RTO if symptoms worsen or unresolved Patient verbalized understanding and denies further questions    Raymon Mutton,  FNP-C

## 2012-12-16 ENCOUNTER — Telehealth: Payer: Self-pay | Admitting: Nurse Practitioner

## 2013-01-03 ENCOUNTER — Telehealth: Payer: Self-pay | Admitting: Nurse Practitioner

## 2013-01-03 ENCOUNTER — Telehealth: Payer: Self-pay | Admitting: General Practice

## 2013-01-04 NOTE — Telephone Encounter (Signed)
Please advise 

## 2013-01-07 NOTE — Telephone Encounter (Signed)
I only seen patient for sinusitis. He would need to be seen for pain issues. thx

## 2013-01-10 NOTE — Telephone Encounter (Signed)
appt made for ck up on depression and pain with new provider to him- [facility]

## 2013-02-01 ENCOUNTER — Ambulatory Visit (INDEPENDENT_AMBULATORY_CARE_PROVIDER_SITE_OTHER): Payer: Medicare Other | Admitting: General Practice

## 2013-02-01 ENCOUNTER — Encounter: Payer: Self-pay | Admitting: General Practice

## 2013-02-01 VITALS — BP 151/77 | HR 96 | Temp 98.2°F | Ht 68.0 in | Wt 234.0 lb

## 2013-02-01 DIAGNOSIS — R0602 Shortness of breath: Secondary | ICD-10-CM

## 2013-02-01 DIAGNOSIS — R609 Edema, unspecified: Secondary | ICD-10-CM

## 2013-02-01 DIAGNOSIS — M25579 Pain in unspecified ankle and joints of unspecified foot: Secondary | ICD-10-CM

## 2013-02-01 DIAGNOSIS — F411 Generalized anxiety disorder: Secondary | ICD-10-CM

## 2013-02-01 DIAGNOSIS — M25572 Pain in left ankle and joints of left foot: Secondary | ICD-10-CM

## 2013-02-01 MED ORDER — HYDROCODONE-ACETAMINOPHEN 5-325 MG PO TABS: 1.0000 | ORAL_TABLET | Freq: Four times a day (QID) | ORAL | Status: DC | PRN

## 2013-02-01 MED ORDER — ALPRAZOLAM 0.25 MG PO TABS: 0.2500 mg | ORAL_TABLET | Freq: Every evening | ORAL | Status: DC | PRN

## 2013-02-01 MED ORDER — HYDROCODONE-ACETAMINOPHEN 5-325 MG PO TABS: 1.0000 | ORAL_TABLET | Freq: Every day | ORAL | Status: DC | PRN

## 2013-02-01 NOTE — Progress Notes (Signed)
  Subjective:    Patient ID: Jay Mclean, male    DOB: 03-Jan-1946, 67 y.o.   MRN: [id number]  HPI Presents today for follow up of chronic health conditions. History of acid reflux, anxiety, and chronic ankle/foot pain. Reports having procedure on left foot/ankle in 1994. Denies being seen by any other ortho specialist since then. Patient reports that he hasn't taken any medications since the end of April or 1st of May.     Review of Systems  Constitutional: Negative for fever and chills.  HENT: Negative for neck pain and neck stiffness.   Eyes: Negative for pain.  Respiratory: Positive for shortness of breath. Negative for chest tightness and wheezing.        Reports being more short of breath over past year.   Cardiovascular: Positive for leg swelling. Negative for chest pain and palpitations.  Gastrointestinal: Negative for abdominal pain and blood in stool.  Genitourinary: Negative for dysuria, hematuria and difficulty urinating.  Musculoskeletal: Negative for myalgias and back pain.  Neurological: Negative for dizziness, weakness, numbness and headaches.  Psychiatric/Behavioral: Negative.        Objective:   Physical Exam  Constitutional: He is oriented to person, place, and time. He appears well-developed and well-nourished.  HENT:  Head: Normocephalic and atraumatic.  Right Ear: External ear normal.  Left Ear: External ear normal.  Mouth/Throat: Oropharynx is clear and moist.  Neck: Normal range of motion.  Cardiovascular: Normal rate, regular rhythm and normal heart sounds.   1+ pitting edema to bilateral lower extremities  Pulmonary/Chest: Effort normal and breath sounds normal. No respiratory distress. He exhibits no tenderness.  Neurological: He is alert and oriented to person, place, and time.  Skin: Skin is warm and dry.  Psychiatric: He has a normal mood and affect.          Assessment & Plan:  1. Pain in joint, ankle and foot, left -  HYDROcodone-acetaminophen (NORCO/VICODIN) 5-325 MG per tablet; Take 1 tablet by mouth daily as needed for pain.  Dispense: 30 tablet; Refill: 0  2. Edema and 3. Shortness of breath - Ambulatory referral to Cardiology -elevate legs while sitting at home -Continue medications as prescribed  4. Generalized Anxiety Disorder -Xanax 0.25mg , one tablet by mouth every night prn, anxiety -Try to determine causes of anxiety  Discussed with patient the goal of decreasing pain and anti-anxiety medications. His vicodin and xanax was decreased during this visit. Patient verbalized understanding and in agreement. He verbalized he felt dizzy when he takes xanax four times daily.  -sedation precaution discussed with patient Patient verbalized understanding Coralie Keens, FNP-C

## 2013-02-01 NOTE — Patient Instructions (Addendum)
Chronic Pain Chronic pain can be defined as pain that is lasting, off and on, and lasts for 3 to 6 months or longer. Many things cause chronic pain, which can make it difficult to make a discrete diagnosis. There are many treatment options available for chronic pain. However, finding a treatment that works well for you may require trying various approaches until a suitable one is found. CAUSES  In some types of chronic medical conditions, the pain is caused by a normal pain response within the body. A normal pain response helps the body identify illness or injury and prevent further damage from being done. In these cases, the cause of the pain may be identified and treated, even if it may not be cured completely. Examples of chronic conditions which can cause chronic pain include:  Inflammation of the joints (arthritis).  Back pain or neck pain (including bulging or herniated disks).  Migraine headaches.  Cancer. In some other types of chronic pain syndromes, the pain is caused by an abnormal pain response within the body. An abnormal pain response is present when there is no ongoing cause (or stimulus) for the pain, or when the cause of the pain is arising from the nerves or nervous system itself. Examples of conditions which can cause chronic pain due to an abnormal pain response include:  Fibromyalgia.  Reflex sympathetic dystrophy (RSD).  Neuropathy (when the nerves themselves are damaged, and may cause pain). DIAGNOSIS  Your caregiver will help diagnose your condition over time. In many cases, the initial focus will be on excluding conditions that could be causing the pain. Depending on your symptoms, your caregiver may order some tests to diagnose your condition. Some of these tests include:  Blood tests.  Computerized X-ray scans (CT scan).  Computerized magnetic scans (MRI).  X-rays.  Ultrasounds.  Nerve conduction studies.  Consultation with other physicians or  specialists. TREATMENT  There are many treatment options for people suffering from chronic pain. Finding a treatment that works well may take time.   You may be referred to a pain management specialist.  You may be put on medication to help with the pain. Unfortunately, some medications (such as opiate medications) may not be very effective in cases where chronic pain is due to abnormal pain responses. Finding the right medications can take some time.  Adjunctive therapies may be used to provide additional relief and improve a patient's quality of life. These therapies include:  Mindfulness meditation.  Acupuncture.  Biofeedback.  Cognitive-behavioral therapy.  In certain cases, surgical interventions may be attempted. HOME CARE INSTRUCTIONS   Make sure you understand these instructions prior to discharge.  Ask any questions and share any further concerns you have with your caregiver prior to discharge.  Take all medications as directed by your caregiver.  Keep all follow-up appointments. SEEK MEDICAL CARE IF:   Your pain gets worse.  You develop a new pain that was not present before.  You cannot tolerate any medications prescribed by your caregiver.  You develop new symptoms since your last visit with your caregiver. SEEK IMMEDIATE MEDICAL CARE IF:   You develop muscular weakness.  You have decreased sensation or numbness.  You lose control of bowel or bladder function.  Your pain suddenly gets much worse.  You have an oral temperature above 102 F (38.9 C), not controlled by medication.  You develop shaking chills, confusion, chest pain, or shortness of breath. Document Released: 05/10/2002 Document Revised: 11/10/2011 Document Reviewed: 08/16/2008 [employer] Patient Information  2014 [place], [place]. Anxiety and Panic Attacks Your caregiver has informed you that you are having an anxiety or panic attack. There may be many forms of this. Most of the time these  attacks come suddenly and without warning. They come at any time of day, including periods of sleep, and at any time of life. They may be strong and unexplained. Although panic attacks are very scary, they are physically harmless. Sometimes the cause of your anxiety is not known. Anxiety is a protective mechanism of the body in its fight or flight mechanism. Most of these perceived danger situations are actually nonphysical situations (such as anxiety over losing a job). CAUSES  The causes of an anxiety or panic attack are many. Panic attacks may occur in otherwise healthy people given a certain set of circumstances. There may be a genetic cause for panic attacks. Some medications may also have anxiety as a side effect. SYMPTOMS  Some of the most common feelings are:  Intense terror.  Dizziness, feeling faint.  Hot and cold flashes.  Fear of going crazy.  Feelings that nothing is real.  Sweating.  Shaking.  Chest pain or a fast heartbeat (palpitations).  Smothering, choking sensations.  Feelings of impending doom and that death is near.  Tingling of extremities, this may be from over-breathing.  Altered reality (derealization).  Being detached from yourself (depersonalization). Several symptoms can be present to make up anxiety or panic attacks. DIAGNOSIS  The evaluation by your caregiver will depend on the type of symptoms you are experiencing. The diagnosis of anxiety or panic attack is made when no physical illness can be determined to be a cause of the symptoms. TREATMENT  Treatment to prevent anxiety and panic attacks may include:  Avoidance of circumstances that cause anxiety.  Reassurance and relaxation.  Regular exercise.  Relaxation therapies, such as yoga.  Psychotherapy with a psychiatrist or therapist.  Avoidance of caffeine, alcohol and illegal drugs.  Prescribed medication. SEEK IMMEDIATE MEDICAL CARE IF:   You experience panic attack symptoms that  are different than your usual symptoms.  You have any worsening or concerning symptoms. Document Released: 08/18/2005 Document Revised: 11/10/2011 Document Reviewed: 12/20/2009 [facility] Patient Information 2014 [place], [place].

## 2013-02-08 ENCOUNTER — Telehealth: Payer: Self-pay | Admitting: General Practice

## 2013-02-08 NOTE — Telephone Encounter (Signed)
Message for your review only.

## 2013-02-08 NOTE — Telephone Encounter (Signed)
Error

## 2013-02-21 NOTE — Telephone Encounter (Signed)
rx done 01/07/2013

## 2013-02-25 ENCOUNTER — Telehealth: Payer: Self-pay | Admitting: General Practice

## 2013-02-25 ENCOUNTER — Other Ambulatory Visit: Payer: Self-pay | Admitting: General Practice

## 2013-02-25 DIAGNOSIS — M25572 Pain in left ankle and joints of left foot: Secondary | ICD-10-CM

## 2013-02-25 MED ORDER — HYDROCODONE-ACETAMINOPHEN 5-325 MG PO TABS: 1.0000 | ORAL_TABLET | Freq: Every day | ORAL | Status: DC | PRN

## 2013-02-25 MED ORDER — ALPRAZOLAM 0.25 MG PO TABS: 0.2500 mg | ORAL_TABLET | Freq: Every evening | ORAL | Status: DC | PRN

## 2013-02-25 NOTE — Telephone Encounter (Signed)
Scripts are ready for pick up. thx

## 2013-02-25 NOTE — Telephone Encounter (Signed)
Mae to address

## 2013-02-28 NOTE — Telephone Encounter (Signed)
Pt aware scripts are up front

## 2013-03-10 ENCOUNTER — Other Ambulatory Visit: Payer: Medicare Other

## 2013-03-14 ENCOUNTER — Encounter (HOSPITAL_COMMUNITY): Payer: Self-pay | Admitting: *Deleted

## 2013-03-14 ENCOUNTER — Emergency Department (HOSPITAL_COMMUNITY)
Admission: EM | Admit: 2013-03-14 | Discharge: 2013-03-14 | Disposition: A | Discharge status: Home / Self Care | Payer: Medicare Other | Attending: Emergency Medicine | Admitting: Emergency Medicine

## 2013-03-14 DIAGNOSIS — G8929 Other chronic pain: Secondary | ICD-10-CM | POA: Insufficient documentation

## 2013-03-14 DIAGNOSIS — Z951 Presence of aortocoronary bypass graft: Secondary | ICD-10-CM | POA: Insufficient documentation

## 2013-03-14 DIAGNOSIS — M1710 Unilateral primary osteoarthritis, unspecified knee: Secondary | ICD-10-CM | POA: Insufficient documentation

## 2013-03-14 DIAGNOSIS — I252 Old myocardial infarction: Secondary | ICD-10-CM | POA: Insufficient documentation

## 2013-03-14 DIAGNOSIS — Z9889 Other specified postprocedural states: Secondary | ICD-10-CM | POA: Insufficient documentation

## 2013-03-14 DIAGNOSIS — M25579 Pain in unspecified ankle and joints of unspecified foot: Secondary | ICD-10-CM | POA: Insufficient documentation

## 2013-03-14 DIAGNOSIS — Z87891 Personal history of nicotine dependence: Secondary | ICD-10-CM | POA: Insufficient documentation

## 2013-03-14 DIAGNOSIS — I10 Essential (primary) hypertension: Secondary | ICD-10-CM | POA: Insufficient documentation

## 2013-03-14 DIAGNOSIS — M25561 Pain in right knee: Secondary | ICD-10-CM

## 2013-03-14 DIAGNOSIS — Z79899 Other long term (current) drug therapy: Secondary | ICD-10-CM | POA: Insufficient documentation

## 2013-03-14 DIAGNOSIS — R609 Edema, unspecified: Secondary | ICD-10-CM | POA: Insufficient documentation

## 2013-03-14 DIAGNOSIS — M25569 Pain in unspecified knee: Secondary | ICD-10-CM | POA: Insufficient documentation

## 2013-03-14 DIAGNOSIS — R52 Pain, unspecified: Secondary | ICD-10-CM | POA: Insufficient documentation

## 2013-03-14 HISTORY — DX: Unspecified osteoarthritis, unspecified site: M19.90

## 2013-03-14 MED ORDER — HYDROCODONE-ACETAMINOPHEN 5-325 MG PO TABS: 1.0000 | ORAL_TABLET | ORAL | Status: DC | PRN

## 2013-03-14 MED ORDER — HYDROCODONE-ACETAMINOPHEN 5-325 MG PO TABS: 2.0000 | ORAL_TABLET | ORAL | Status: DC | PRN

## 2013-03-14 NOTE — ED Notes (Signed)
Rt knee and both ankles hurt, chronic pain.  Says he is out of his pain meds.

## 2013-03-15 ENCOUNTER — Other Ambulatory Visit: Payer: Self-pay | Admitting: General Practice

## 2013-03-15 ENCOUNTER — Telehealth: Payer: Self-pay | Admitting: *Deleted

## 2013-03-15 ENCOUNTER — Other Ambulatory Visit: Payer: Medicare Other

## 2013-03-15 MED ORDER — FUROSEMIDE 20 MG PO TABS: 20.0000 mg | ORAL_TABLET | Freq: Every day | ORAL | Status: DC

## 2013-03-15 NOTE — Telephone Encounter (Signed)
Patient is requesting refill on lasix. Also came in and had blood drawn this am. Can you please order labs. Cant find any documentation as to what to order

## 2013-03-18 NOTE — ED Provider Notes (Signed)
History    CSN: [id number] Arrival date & time 03/14/13  1815  First MD Initiated Contact with Patient 03/14/13 1907     Chief Complaint  Patient presents with  . Knee Pain   (Consider location/radiation/quality/duration/timing/severity/associated sxs/prior Treatment) HPI Comments: Jay Mclean is a 67 y.o. Male presenting with acute on chronic knee and ankle pain.  He reports running out of his hydrocodone which he takes one tablet daily for his knee arthritis last weekend. He has had problems sleeping due to increased pain.  He denies any new injury.  Additionally he describes increased swelling in his bilateral ankles since he ran out of his lasix.  He was seen by his pcp last week and he asked about a lasix refill, reporting his doctor never answered him and did not give a refill.   He was scheduled for a lab visit 2 days ago with his pcp, but did not make this appointment.  He denies increased shortness of breath, chest pain, cough and orthopnea.  His pain is aching and consistent with his chronic arthritis pain.   The history is provided by the patient.   Past Medical History  Diagnosis Date  . MI (myocardial infarction)   . Hypertension   . Arthritis    Past Surgical History  Procedure Laterality Date  . Foot surgery    . Coronary artery bypass graft  1998    x3  . Hiatal hernia surgery     History reviewed. No pertinent family history. History  Substance Use Topics  . Smoking status: Former Smoker    Types: Cigarettes    Quit date: 09/01/1982  . Smokeless tobacco: Not on file  . Alcohol Use: No    Review of Systems  Constitutional: Negative for fever and chills.  HENT: Negative for congestion, sore throat and neck pain.   Eyes: Negative.   Respiratory: Negative for cough, chest tightness and shortness of breath.   Cardiovascular: Negative for chest pain.  Gastrointestinal: Negative for nausea and abdominal pain.  Genitourinary: Negative.    Musculoskeletal: Positive for arthralgias. Negative for myalgias and joint swelling.  Skin: Negative.  Negative for color change, rash and wound.  Neurological: Negative for dizziness, weakness, light-headedness, numbness and headaches.  Psychiatric/Behavioral: Negative.     Allergies  Ambien; Aspirin; and Ibuprofen  Home Medications   Current Outpatient Rx  Name  Route  Sig  Dispense  Refill  . ALPRAZolam (XANAX) 0.25 MG tablet   Oral   Take 1 tablet (0.25 mg total) by mouth at bedtime as needed for sleep or anxiety.   30 tablet   0   . HYDROcodone-acetaminophen (NORCO/VICODIN) 5-325 MG per tablet   Oral   Take 1 tablet by mouth daily as needed for pain.   30 tablet   0   . omeprazole (PRILOSEC) 40 MG capsule   Oral   Take 40 mg by mouth daily.         . furosemide (LASIX) 20 MG tablet   Oral   Take 1 tablet (20 mg total) by mouth daily.   30 tablet   3   . HYDROcodone-acetaminophen (NORCO/VICODIN) 5-325 MG per tablet   Oral   Take 2 tablets by mouth every 4 (four) hours as needed for pain.   6 tablet   0   . HYDROcodone-acetaminophen (NORCO/VICODIN) 5-325 MG per tablet   Oral   Take 1 tablet by mouth every 4 (four) hours as needed.   30 tablet  0    BP 144/64  Pulse 87  Temp(Src) 98.2 F (36.8 C) (Oral)  Resp 18  Ht 5\' 8"  (1.727 m)  Wt 254 lb (115.214 kg)  BMI 38.63 kg/m2  SpO2 98% Physical Exam  Constitutional: He appears well-developed and well-nourished.  HENT:  Head: Atraumatic.  Neck: Normal range of motion.  Cardiovascular:  Pulses equal bilaterally  Pulmonary/Chest: He has no decreased breath sounds. He has no wheezes. He has no rhonchi. He has no rales.  Musculoskeletal: He exhibits tenderness.       Left knee: He exhibits no swelling, no erythema, no LCL laxity and no MCL laxity. Tenderness found. Medial joint line and lateral joint line tenderness noted.  Neurological: He is alert. He has normal strength. He displays normal reflexes.  No sensory deficit.  Equal strength  Skin: Skin is warm and dry.  Trace edema bilateral ankles.  Dorsalis pedis pulses intact.  No calf tenderness,  No erythema or other skin changes in extremities.  Psychiatric: He has a normal mood and affect.    ED Course  Procedures (including critical care time) Labs Reviewed - No data to display No results found. 1. Knee pain, chronic, right     MDM  Pt was prescribed hydrocodone and given prepack since it is after pharmacy hours- instructed to not take first tablet until home (is driving).  Also encouraged to f/u with his pcp tomorrow for his scheduled lab visit and to inquire of pcp for refill of lasix.  Advised pt this med should be discussed with pcp as there may be a reason he is holding this med.  Pt understands and will go for lab visit in am.   Burgess Amor, PA-C 03/19/13 0017

## 2013-03-19 NOTE — ED Provider Notes (Signed)
Medical screening examination/treatment/procedure(s) were performed by non-physician practitioner and as supervising physician I was immediately available for consultation/collaboration.   Endy Easterly L Manley Fason, MD 03/19/13 0935 

## 2013-03-21 MED FILL — Hydrocodone-Acetaminophen Tab 5-325 MG: ORAL | Qty: 6 | Status: AC

## 2013-03-22 ENCOUNTER — Ambulatory Visit (INDEPENDENT_AMBULATORY_CARE_PROVIDER_SITE_OTHER): Payer: Medicare Other | Admitting: Cardiovascular Disease

## 2013-03-22 ENCOUNTER — Encounter: Payer: Self-pay | Admitting: Cardiovascular Disease

## 2013-03-22 ENCOUNTER — Encounter: Payer: Self-pay | Admitting: *Deleted

## 2013-03-22 VITALS — BP 142/74 | HR 96 | Ht 68.0 in | Wt 234.0 lb

## 2013-03-22 DIAGNOSIS — I10 Essential (primary) hypertension: Secondary | ICD-10-CM

## 2013-03-22 DIAGNOSIS — R0602 Shortness of breath: Secondary | ICD-10-CM

## 2013-03-22 DIAGNOSIS — R011 Cardiac murmur, unspecified: Secondary | ICD-10-CM

## 2013-03-22 DIAGNOSIS — R072 Precordial pain: Secondary | ICD-10-CM

## 2013-03-22 DIAGNOSIS — E7800 Pure hypercholesterolemia, unspecified: Secondary | ICD-10-CM

## 2013-03-22 DIAGNOSIS — R600 Localized edema: Secondary | ICD-10-CM

## 2013-03-22 DIAGNOSIS — Z951 Presence of aortocoronary bypass graft: Secondary | ICD-10-CM

## 2013-03-22 DIAGNOSIS — R609 Edema, unspecified: Secondary | ICD-10-CM

## 2013-03-22 MED ORDER — HYDROCHLOROTHIAZIDE 25 MG PO TABS: 25.0000 mg | ORAL_TABLET | Freq: Every day | ORAL | Status: DC

## 2013-03-22 NOTE — Patient Instructions (Addendum)
Your physician has recommended you make the following change in your medication:   START HCTZ 25 MG ONCE A DAY  Your physician recommends that you return for lab work in:    BMET IN 1 WEEK LIPID ING 1 WEEK  Your physician has requested that you have an echocardiogram. Echocardiography is a painless test that uses sound waves to create images of your heart. It provides your doctor with information about the size and shape of your heart and how well your heart's chambers and valves are working. This procedure takes approximately one hour. There are no restrictions for this procedure.  Your physician has requested that you have a lexiscan myoview.  Please follow instruction sheet, as given.

## 2013-03-22 NOTE — Progress Notes (Signed)
Patient ID: Jay Mclean., male   DOB: Nov 06, 1945, 67 y.o.   MRN: [id number]    CARDIOLOGY CONSULT NOTE  Patient ID: Jay ADAMCIK Sr. MRN: [id number] DOB/AGE: 11/19/1945 67 y.o.    HPI:  Mr. Risby is referred today for the evaluation of lower extremity edema and shortness of breath. He underwent a right lower extremity Doppler which was negative for DVT. He also has a h/o GERD and anxiety.  He has a h/o 3-vessel CABG in February 1998 at [facility]. He says he hasn't seen a cardiologist since that time.  Prior to his bypass, he experienced both chest pain and shortness of breath.  He denies having had any cardiac testing in over a decade.  He currently denies chest pain. He gets short of breath while walking or lifting heavy objects, but thinks his dyspnea is due to holding his breath during those times. He's had chronic ankle and feet swelling since he broke them in 1994.  He's had leg cramping and swelling in the past several months. He denies palpitations, lightheadedness, dizziness, and syncope.  He is unable to take ASA due to an upper GI bleed (1997). He said when he took 3-4 Advil this past March, he required 4 units of blood.  He had been taking Lasix for 3 months but is not on it any longer.  Review of systems complete and found to be negative unless listed above in HPI  Past Medical History: see HPI   No family history on file.  History   Social History  . Marital Status: Widowed    Spouse Name: N/A    Number of Children: N/A  . Years of Education: N/A   Occupational History  . Not on file.   Social History Main Topics  . Smoking status: Former Smoker    Types: Cigarettes    Quit date: 09/01/1982  . Smokeless tobacco: Not on file  . Alcohol Use: No  . Drug Use: No  . Sexually Active: Not on file   Other Topics Concern  . Not on file   Social History Narrative  . No narrative on file      Physical exam BP 142/74   Pulse 96    General: NAD Neck: No JVD, no thyromegaly or thyroid nodule.  Lungs: Clear to auscultation bilaterally with normal respiratory effort. CV: Nondisplaced PMI.  Heart regular S1/S2, no S3/S4, II/VI ejection systolic murmur.  2+ pitting pedal edema.  No carotid bruit.  Normal pedal pulses.  Abdomen: Soft, nontender, no hepatosplenomegaly, no distention.  Skin: Intact without lesions or rashes.  Neurologic: Alert and oriented x 3.  Psych: Normal affect. Extremities: No clubbing or cyanosis.  HEENT: Normal.   Labs:   Lab Results  Component Value Date   WBC 6.0 04/06/2010   HGB 12.3* 04/06/2010   HCT 36.3* 04/06/2010   MCV 98.6 04/06/2010   PLT 153 04/06/2010   No results found for this basename: NA, K, CL, CO2, BUN, CREATININE, CALCIUM, LABALBU, PROT, BILITOT, ALKPHOS, ALT, AST, GLUCOSE,  in the last 168 hours Lab Results  Component Value Date   CKTOTAL 51 04/07/2010   CKMB 0.8 04/07/2010   TROPONINI  Value: <0.01        NO INDICATION OF MYOCARDIAL INJURY. 04/07/2010    Lab Results  Component Value Date   CHOL  Value: 149        ATP III CLASSIFICATION:  <200     mg/dL   Desirable  200-239  mg/dL   Borderline High  >=161    mg/dL   High        0/05/6044   Lab Results  Component Value Date   HDL 45 04/07/2010   Lab Results  Component Value Date   LDLCALC  Value: 88        Total Cholesterol/HDL:CHD Risk Coronary Heart Disease Risk Table                     Men   Women  1/2 Average Risk   3.4   3.3  Average Risk       5.0   4.4  2 X Average Risk   9.6   7.1  3 X Average Risk  23.4   11.0        Use the calculated Patient Ratio above and the CHD Risk Table to determine the patient's CHD Risk.        ATP III CLASSIFICATION (LDL):  <100     mg/dL   Optimal  409-811  mg/dL   Near or Above                    Optimal  130-159  mg/dL   Borderline  914-782  mg/dL   High  >956     mg/dL   Very High 10/02/3084   Lab Results  Component Value Date   TRIG 78 04/07/2010   Lab Results  Component Value  Date   CHOLHDL 3.3 04/07/2010   No results found for this basename: LDLDIRECT       EKG: Sinus rhythm, rate 96 bpm, axis within normal limits, intervals within normal limits, no acute ST-T wave changes.     ASSESSMENT AND PLAN:  1. Shortness of breath: this may be an anginal equivalent, so will proceed with a Lexiscan Cardiolite stress test and echocardiogram to evaluate his systolic and diastolic function. 2. Leg edema: will start HCTZ 25 mg daily, which will concomitantly help control his HTN. This may be due to systolic and/or diastolic dysfunction, and thus an echocardiogram will help evaluate for any potential structural heart disease. 3. CAD s/p 3-v CABG: unable to take ASA. He is not on a statin.  4. HTN: HCTZ 25 mg daily, and check a BMP in 1 week. 5. Hypercholesterolemia: will see if lipids were done recently. Would consider statin therapy.   Signed: Prentice Docker, M.D., F.A.C.C. 03/22/2013, 1:22 PM

## 2013-03-23 ENCOUNTER — Telehealth: Payer: Self-pay | Admitting: Cardiovascular Disease

## 2013-03-23 NOTE — Telephone Encounter (Signed)
Jay Mclean set for 03-31-13 @ [facility]

## 2013-03-24 ENCOUNTER — Other Ambulatory Visit: Payer: Self-pay | Admitting: Nurse Practitioner

## 2013-03-24 NOTE — Telephone Encounter (Signed)
Auth # [id number] exp 05/08/13

## 2013-03-25 NOTE — Telephone Encounter (Signed)
Careful, last filled 02/26/13, last seen 02/25/13. Call pt to pick up if approved ([facility])

## 2013-03-28 NOTE — Telephone Encounter (Signed)
Can you verify with pharmacy who has been prescribing tramadol for this patient. thx

## 2013-03-28 NOTE — Telephone Encounter (Signed)
Please advise 

## 2013-03-29 NOTE — Telephone Encounter (Signed)
Ok. Then he should continue getting it from that provider. thx

## 2013-03-29 NOTE — Telephone Encounter (Signed)
On 03/22/2013 he got 21 from the ER,  Then on  6/28 he got 60 from [facility]  On 06/14 he got #30 from ER And everything prior to 6/14 he has received from [facility]. We have not filled them here it looks like [facility] is filling

## 2013-03-30 ENCOUNTER — Other Ambulatory Visit: Payer: Medicare Other

## 2013-03-30 DIAGNOSIS — R0989 Other specified symptoms and signs involving the circulatory and respiratory systems: Secondary | ICD-10-CM

## 2013-03-31 NOTE — Telephone Encounter (Signed)
Pt aware, he must get refills from [facility] as before.

## 2013-04-01 ENCOUNTER — Other Ambulatory Visit: Payer: Self-pay

## 2013-04-01 ENCOUNTER — Other Ambulatory Visit: Payer: Self-pay | Admitting: General Practice

## 2013-04-01 ENCOUNTER — Telehealth: Payer: Self-pay | Admitting: General Practice

## 2013-04-01 DIAGNOSIS — M79672 Pain in left foot: Secondary | ICD-10-CM

## 2013-04-01 DIAGNOSIS — M25572 Pain in left ankle and joints of left foot: Secondary | ICD-10-CM

## 2013-04-01 MED ORDER — HYDROCODONE-ACETAMINOPHEN 5-325 MG PO TABS: 1.0000 | ORAL_TABLET | Freq: Every day | ORAL | Status: DC | PRN

## 2013-04-01 NOTE — Telephone Encounter (Signed)
Last seen 02/01/13  Jay Mclean   If approved print and route to nurse

## 2013-04-01 NOTE — Telephone Encounter (Signed)
Please advise 

## 2013-04-01 NOTE — Telephone Encounter (Signed)
Please let patient know script is ready for pick up. thx

## 2013-04-01 NOTE — Telephone Encounter (Signed)
Patient notified that referral had been made and that [facility] should be contacting him with appt. States that he is out of pain meds and doesn't know what to do until he sees them. Please advise

## 2013-04-01 NOTE — Telephone Encounter (Signed)
Referral request made, he should be contact with appointment date and time. thx

## 2013-04-04 ENCOUNTER — Encounter: Payer: Self-pay | Admitting: Physician Assistant

## 2013-04-06 ENCOUNTER — Telehealth: Payer: Self-pay | Admitting: *Deleted

## 2013-04-06 NOTE — Telephone Encounter (Signed)
Pt aware, rx for vicodin ready. 

## 2013-04-06 NOTE — Telephone Encounter (Signed)
Script for pain medication has been refilled. thx

## 2013-04-17 ENCOUNTER — Emergency Department (HOSPITAL_COMMUNITY): Payer: Medicare Other

## 2013-04-17 ENCOUNTER — Inpatient Hospital Stay (HOSPITAL_COMMUNITY)
Admission: EM | Admit: 2013-04-17 | Discharge: 2013-04-26 | DRG: 871 | Discharge status: Against Medical Advice | Payer: Medicare Other | Attending: Internal Medicine | Admitting: Internal Medicine

## 2013-04-17 ENCOUNTER — Encounter (HOSPITAL_COMMUNITY): Payer: Self-pay

## 2013-04-17 DIAGNOSIS — I10 Essential (primary) hypertension: Secondary | ICD-10-CM

## 2013-04-17 DIAGNOSIS — D65 Disseminated intravascular coagulation [defibrination syndrome]: Secondary | ICD-10-CM | POA: Diagnosis not present

## 2013-04-17 DIAGNOSIS — R0602 Shortness of breath: Secondary | ICD-10-CM

## 2013-04-17 DIAGNOSIS — F411 Generalized anxiety disorder: Secondary | ICD-10-CM

## 2013-04-17 DIAGNOSIS — I5033 Acute on chronic diastolic (congestive) heart failure: Secondary | ICD-10-CM | POA: Diagnosis not present

## 2013-04-17 DIAGNOSIS — I219 Acute myocardial infarction, unspecified: Secondary | ICD-10-CM | POA: Insufficient documentation

## 2013-04-17 DIAGNOSIS — I2510 Atherosclerotic heart disease of native coronary artery without angina pectoris: Secondary | ICD-10-CM | POA: Diagnosis present

## 2013-04-17 DIAGNOSIS — R6520 Severe sepsis without septic shock: Secondary | ICD-10-CM | POA: Diagnosis present

## 2013-04-17 DIAGNOSIS — D696 Thrombocytopenia, unspecified: Secondary | ICD-10-CM

## 2013-04-17 DIAGNOSIS — I959 Hypotension, unspecified: Secondary | ICD-10-CM

## 2013-04-17 DIAGNOSIS — E860 Dehydration: Secondary | ICD-10-CM | POA: Diagnosis present

## 2013-04-17 DIAGNOSIS — E872 Acidosis, unspecified: Secondary | ICD-10-CM | POA: Diagnosis present

## 2013-04-17 DIAGNOSIS — K5289 Other specified noninfective gastroenteritis and colitis: Secondary | ICD-10-CM | POA: Diagnosis present

## 2013-04-17 DIAGNOSIS — K7460 Unspecified cirrhosis of liver: Secondary | ICD-10-CM | POA: Diagnosis present

## 2013-04-17 DIAGNOSIS — Z951 Presence of aortocoronary bypass graft: Secondary | ICD-10-CM

## 2013-04-17 DIAGNOSIS — M79609 Pain in unspecified limb: Secondary | ICD-10-CM

## 2013-04-17 DIAGNOSIS — R072 Precordial pain: Secondary | ICD-10-CM

## 2013-04-17 DIAGNOSIS — I509 Heart failure, unspecified: Secondary | ICD-10-CM | POA: Diagnosis present

## 2013-04-17 DIAGNOSIS — R7989 Other specified abnormal findings of blood chemistry: Secondary | ICD-10-CM

## 2013-04-17 DIAGNOSIS — K529 Noninfective gastroenteritis and colitis, unspecified: Secondary | ICD-10-CM | POA: Diagnosis present

## 2013-04-17 DIAGNOSIS — M129 Arthropathy, unspecified: Secondary | ICD-10-CM | POA: Diagnosis present

## 2013-04-17 DIAGNOSIS — A419 Sepsis, unspecified organism: Principal | ICD-10-CM | POA: Diagnosis present

## 2013-04-17 DIAGNOSIS — Z87891 Personal history of nicotine dependence: Secondary | ICD-10-CM

## 2013-04-17 DIAGNOSIS — E875 Hyperkalemia: Secondary | ICD-10-CM | POA: Diagnosis not present

## 2013-04-17 DIAGNOSIS — N170 Acute kidney failure with tubular necrosis: Secondary | ICD-10-CM | POA: Diagnosis present

## 2013-04-17 DIAGNOSIS — D649 Anemia, unspecified: Secondary | ICD-10-CM | POA: Diagnosis present

## 2013-04-17 DIAGNOSIS — I252 Old myocardial infarction: Secondary | ICD-10-CM

## 2013-04-17 DIAGNOSIS — A498 Other bacterial infections of unspecified site: Secondary | ICD-10-CM | POA: Diagnosis present

## 2013-04-17 DIAGNOSIS — R195 Other fecal abnormalities: Secondary | ICD-10-CM

## 2013-04-17 DIAGNOSIS — I129 Hypertensive chronic kidney disease with stage 1 through stage 4 chronic kidney disease, or unspecified chronic kidney disease: Secondary | ICD-10-CM | POA: Diagnosis present

## 2013-04-17 DIAGNOSIS — N179 Acute kidney failure, unspecified: Secondary | ICD-10-CM

## 2013-04-17 DIAGNOSIS — N12 Tubulo-interstitial nephritis, not specified as acute or chronic: Secondary | ICD-10-CM | POA: Diagnosis present

## 2013-04-17 DIAGNOSIS — I5031 Acute diastolic (congestive) heart failure: Secondary | ICD-10-CM

## 2013-04-17 DIAGNOSIS — R6510 Systemic inflammatory response syndrome (SIRS) of non-infectious origin without acute organ dysfunction: Secondary | ICD-10-CM | POA: Diagnosis present

## 2013-04-17 HISTORY — DX: Hyperlipidemia, unspecified: E78.5

## 2013-04-17 HISTORY — DX: Shortness of breath: R06.02

## 2013-04-17 HISTORY — DX: Peptic ulcer, site unspecified, unspecified as acute or chronic, without hemorrhage or perforation: K27.9

## 2013-04-17 HISTORY — DX: Other chronic pain: G89.29

## 2013-04-17 HISTORY — DX: Personal history of other diseases of the digestive system: Z87.19

## 2013-04-17 HISTORY — DX: Localized edema: R60.0

## 2013-04-17 HISTORY — DX: Gastrointestinal hemorrhage, unspecified: K92.2

## 2013-04-17 LAB — POCT I-STAT 3, ART BLOOD GAS (G3+)
O2 Saturation: 99 %
Patient temperature: 98.2
TCO2: 14 mmol/L (ref 0–100)

## 2013-04-17 LAB — URINALYSIS W MICROSCOPIC + REFLEX CULTURE
Glucose, UA: NEGATIVE mg/dL
Ketones, ur: 15 mg/dL — AB
pH: 5 (ref 5.0–8.0)

## 2013-04-17 LAB — CBC WITH DIFFERENTIAL/PLATELET
Eosinophils Relative: 0 % (ref 0–5)
HCT: 28 % — ABNORMAL LOW (ref 39.0–52.0)
Hemoglobin: 9.4 g/dL — ABNORMAL LOW (ref 13.0–17.0)
Lymphocytes Relative: 7 % — ABNORMAL LOW (ref 12–46)
Lymphs Abs: 0.5 10*3/uL — ABNORMAL LOW (ref 0.7–4.0)
MCV: 97.2 fL (ref 78.0–100.0)
Monocytes Absolute: 0.2 10*3/uL (ref 0.1–1.0)
Monocytes Relative: 2 % — ABNORMAL LOW (ref 3–12)
Platelets: 63 10*3/uL — ABNORMAL LOW (ref 150–400)
RBC: 2.88 MIL/uL — ABNORMAL LOW (ref 4.22–5.81)
WBC: 7.2 10*3/uL (ref 4.0–10.5)

## 2013-04-17 LAB — PREPARE RBC (CROSSMATCH)

## 2013-04-17 LAB — COMPREHENSIVE METABOLIC PANEL
ALT: 16 U/L (ref 0–53)
CO2: 14 mEq/L — ABNORMAL LOW (ref 19–32)
Calcium: 8.5 mg/dL (ref 8.4–10.5)
GFR calc Af Amer: 22 mL/min — ABNORMAL LOW (ref >90)
GFR calc non Af Amer: 19 mL/min — ABNORMAL LOW (ref >90)
Glucose, Bld: 98 mg/dL (ref 70–99)
Sodium: 141 mEq/L (ref 135–145)

## 2013-04-17 LAB — OCCULT BLOOD, POC DEVICE: Fecal Occult Bld: POSITIVE — AB

## 2013-04-17 LAB — CG4 I-STAT (LACTIC ACID): Lactic Acid, Venous: 4.06 mmol/L — ABNORMAL HIGH (ref 0.5–2.2)

## 2013-04-17 LAB — MRSA PCR SCREENING: MRSA by PCR: NEGATIVE

## 2013-04-17 LAB — D-DIMER, QUANTITATIVE: D-Dimer, Quant: 2.45 ug/mL-FEU — ABNORMAL HIGH (ref 0.00–0.48)

## 2013-04-17 LAB — SODIUM, URINE, RANDOM: Sodium, Ur: 34 mEq/L

## 2013-04-17 MED ORDER — METRONIDAZOLE IN NACL 5-0.79 MG/ML-% IV SOLN
500.0000 mg | Freq: Three times a day (TID) | INTRAVENOUS | Status: DC
  Administered 2013-04-17 – 2013-04-18 (×5): 500 mg via INTRAVENOUS
  Filled 2013-04-17 (×7): qty 100

## 2013-04-17 MED ORDER — SODIUM CHLORIDE 0.9 % IV BOLUS (SEPSIS)
1000.0000 mL | Freq: Once | INTRAVENOUS | Status: AC
  Administered 2013-04-17: 1000 mL via INTRAVENOUS

## 2013-04-17 MED ORDER — ONDANSETRON HCL 4 MG PO TABS: 4.0000 mg | ORAL_TABLET | Freq: Four times a day (QID) | ORAL | Status: DC | PRN

## 2013-04-17 MED ORDER — SODIUM BICARBONATE 8.4 % IV SOLN
50.0000 meq | Freq: Once | INTRAVENOUS | Status: AC
  Administered 2013-04-17: 50 meq via INTRAVENOUS
  Filled 2013-04-17: qty 50

## 2013-04-17 MED ORDER — ONDANSETRON HCL 4 MG/2ML IJ SOLN: 4.0000 mg | Freq: Four times a day (QID) | INTRAMUSCULAR | Status: DC | PRN

## 2013-04-17 MED ORDER — SODIUM CHLORIDE 0.9 % IJ SOLN
3.0000 mL | Freq: Two times a day (BID) | INTRAMUSCULAR | Status: DC
  Administered 2013-04-17 – 2013-04-26 (×14): 3 mL via INTRAVENOUS

## 2013-04-17 MED ORDER — PANTOPRAZOLE SODIUM 40 MG PO TBEC
40.0000 mg | DELAYED_RELEASE_TABLET | Freq: Every day | ORAL | Status: DC
  Administered 2013-04-17: 40 mg via ORAL
  Filled 2013-04-17: qty 1

## 2013-04-17 MED ORDER — GUAIFENESIN-DM 100-10 MG/5ML PO SYRP: 5.0000 mL | ORAL_SOLUTION | ORAL | Status: DC | PRN

## 2013-04-17 MED ORDER — SODIUM BICARBONATE 8.4 % IV SOLN
INTRAVENOUS | Status: AC
  Administered 2013-04-17: 18:00:00 via INTRAVENOUS
  Filled 2013-04-17 (×3): qty 150

## 2013-04-17 MED ORDER — ALPRAZOLAM 0.25 MG PO TABS
0.2500 mg | ORAL_TABLET | Freq: Every evening | ORAL | Status: DC | PRN
  Administered 2013-04-17 – 2013-04-25 (×6): 0.25 mg via ORAL
  Filled 2013-04-17 (×7): qty 1

## 2013-04-17 MED ORDER — SODIUM BICARBONATE 8.4 % IV SOLN
INTRAVENOUS | Status: DC
  Administered 2013-04-17: 07:00:00 via INTRAVENOUS
  Filled 2013-04-17: qty 150

## 2013-04-17 MED ORDER — ALBUTEROL SULFATE (5 MG/ML) 0.5% IN NEBU
2.5000 mg | INHALATION_SOLUTION | RESPIRATORY_TRACT | Status: DC | PRN
  Administered 2013-04-17: 2.5 mg via RESPIRATORY_TRACT
  Filled 2013-04-17: qty 0.5

## 2013-04-17 MED ORDER — HYDROCODONE-ACETAMINOPHEN 5-325 MG PO TABS
1.0000 | ORAL_TABLET | ORAL | Status: DC | PRN
  Administered 2013-04-17: 2 via ORAL
  Administered 2013-04-17 (×2): 1 via ORAL
  Administered 2013-04-18: 2 via ORAL
  Administered 2013-04-18: 1 via ORAL
  Administered 2013-04-18 – 2013-04-24 (×11): 2 via ORAL
  Administered 2013-04-25: 1 via ORAL
  Administered 2013-04-25 – 2013-04-26 (×2): 2 via ORAL
  Filled 2013-04-17 (×8): qty 2
  Filled 2013-04-17 (×3): qty 1
  Filled 2013-04-17 (×3): qty 2
  Filled 2013-04-17: qty 1
  Filled 2013-04-17 (×4): qty 2

## 2013-04-17 NOTE — Consult Note (Signed)
Name: Jay DENTE Sr. MRN: [id number] DOB: 11-04-1945    LOS: 0  Referring Provider:  Dr. Preston Fleeting  Reason for Referral:  Hypotension, shortness of breath  PULMONARY / CRITICAL CARE MEDICINE  HPI:  [name] is a 67 y/o man with chronic leg pain requiring narcotic medication, HTN and recent history of a gastric ulcer and GI bleed who presented to the ED via EMS with shortness of breath.  He reports that his shortness of breath is not new but was worse today prompting him to call 911.  He also reports that earlier today he had copious watery diarrhea which he described as dysentery, he took immodium which improved the diarrhea.  He had abdominal pain and cramping with the diarrhea that has improved some.  This coincided with the onset of his shortness of breath.  Per ER notes on arrival to the ED he was noted to be in svt with a rate of 180.  He was given adenosine x3 with no improvement.  He was then given diltiazem x2 with improvement of rate to the 120s.  He had mild chest pain which is now resolved.  Of note he was recently started on HCTZ for hypertension.   Past Medical History  Diagnosis Date  . MI (myocardial infarction)   . Hypertension   . Arthritis   . Coronary atherosclerosis of native coronary artery   . Acute kidney failure, unspecified    Past Surgical History  Procedure Laterality Date  . Foot surgery    . Coronary artery bypass graft  1998    x3  . Hiatal hernia surgery     Prior to Admission medications   Medication Sig Start Date End Date Taking? Authorizing Provider  ALPRAZolam (XANAX) 0.25 MG tablet Take 1 tablet (0.25 mg total) by mouth at bedtime as needed for sleep or anxiety. 02/25/13  Yes Mae Shelda Altes, FNP  fish oil-omega-3 fatty acids 1000 MG capsule Take 1 g by mouth daily.   Yes Historical Provider, MD  hydrochlorothiazide (HYDRODIURIL) 25 MG tablet Take 1 tablet (25 mg total) by mouth daily. 03/22/13  Yes Laqueta Linden, MD   HYDROcodone-acetaminophen (NORCO/VICODIN) 5-325 MG per tablet Take 1 tablet by mouth daily as needed for pain. 04/01/13  Yes Mae Shelda Altes, FNP  omeprazole (PRILOSEC) 40 MG capsule Take 40 mg by mouth daily as needed (for acid reflux).  11/04/12  Yes Historical Provider, MD   Allergies Allergies  Allergen Reactions  . Ambien [Zolpidem Tartrate] Other (See Comments)    Sleep walking and amnesia  . Aspirin     REACTION: GI BLEED  . Ibuprofen     REACTION: GI BLEED    Family History No family history on file. Social History  reports that he quit smoking about 30 years ago. His smoking use included Cigarettes. He smoked 0.00 packs per day. He does not have any smokeless tobacco history on file. He reports that he does not drink alcohol or use illicit drugs.  Review Of Systems:  A review of 14 systems was negative except as stated in the HPI.   Brief patient description:  67 y/o man with non-anion gap acidosis and acute renal failure, likely due to combination of HCTZ and diarrhea.    Current Status: fair  Vital Signs: Pulse Rate:  [110-125] 114 (08/17 0530) Resp:  [22-28] 27 (08/17 0515) BP: (71-103)/(33-56) 96/39 mmHg (08/17 0530) SpO2:  [100 %] 100 % (08/17 0530)  Physical Examination: General:  Elderly man  laying in bed in no acute distress Neuro:  Alert and oriented, pleasant, able to converse in 3-4 word sentences HEENT:  PERRL, EOMI, OP clear, poor dentition Neck:  Supple, no massees Cardiovascular:  NRRR, II/VI blowing murmur that radiates to the axilla, no gallop Lungs:  CTAB Abdomen:  Soft, NTND, diminished bowel sounds throughout, no HSM Musculoskeletal:  No joint deformities, 1+ non-pitting edema of bilateral lower extremities Skin:  No rashes, bruises or petichiae  Principal Problem:   Acute renal failure Active Problems:   Thrombocytopenia   ASSESSMENT AND PLAN  PULMONARY No results found for this basename: PHART, PCO2, PCO2ART, PO2ART, HCO3, O2SAT,  in  the last 168 hours Ventilator Settings:   CXR:  No acute findings   A:  Tachypnea and shortness of breath P:   Likely due to respiratory compensation for metabolic acidosis.  Will obtain ABG to further eval acid base status  CARDIOVASCULAR  Recent Labs Lab 04/17/13 0346 04/17/13 0455  TROPONINI <0.30  --   LATICACIDVEN  --  4.06*  PROBNP 394.1*  --    ECG:  Sinus tachycardia Lines: PIV  A: Tachycardia P:  2/2 to volume depletion Continue to resuscitate with IV fluids  RENAL  Recent Labs Lab 04/17/13 0346  NA 141  K 4.1  CL 112  CO2 14*  BUN 52*  CREATININE 3.05*  CALCIUM 8.5   Intake/Output   None     A:  ARF, non-anion gap acidosis P:   BMP in April with Cr of 1.18 Likely secondary to HCTZ and diarrhea Acidosis likely secondary to renal failure Would send urine cr/sod to calculate FeNa to eval for pre-renal vs. Renal disease (these have been ordered) Renal ultrasound if paranchymal disease suspected Given sodium bicarb push and started on bicarb drip   GASTROINTESTINAL  Recent Labs Lab 04/17/13 0346  AST 27  ALT 16  ALKPHOS 88  BILITOT 1.4*  PROT 5.9*  ALBUMIN 2.6*    A:  Diarrhea P:   Pt took immodium, diarrhea resolved Started flagyl to cover common pathogens Can send stool studies if diarrhea recurs  HEMATOLOGIC  Recent Labs Lab 04/17/13 0346  HGB 9.4*  HCT 28.0*  PLT 63*   A:  Anemia, thrombocytopenia P:  Anemia - chronic - Hgb in April 9.8.  Pt has history of gastric ulcer and is currently fecal occult blood positive - consider GI consult or outpt referral for further evaluation Thrombocytopenia - plt ct in April 105.  Cause of current thrombocytopenia unclear.  Would repeat next CBC in citrate tube to rule out pseudothrombocytopenia.  I reviewed his peripheral smear and there were no schistocytes making TTP unlikely.  Pt has not been hospitalized recently and has not gotten recent heparin so HIT unlikely.  Consider  hematology consult if thrombocytopenia persists.  INFECTIOUS  Recent Labs Lab 04/17/13 0346  WBC 7.2   Cultures: none Antibiotics: flagyl  A:  Diarrhea P:   Pt denies recent fever or chills Diarrhea could be infectious (viral or bacterial).  Will cover with flagyl for common bacterial pathogens.   I spent 35 minutes of critical care time in the care of this patient separate from procedures which are documented elsewhere   Belinda Block, Charlann Lange., M.D. Pulmonary and Critical Care Medicine [facility] Pager: [phone]  04/17/2013, 6:06 AM

## 2013-04-17 NOTE — H&P (Addendum)
[facility]                                                                                    Patient Demographics  Jay Mclean, is a 67 y.o. male  CSN: [id number]  MRN: [id number]  DOB - 10/12/1945  Admit Date - 04/17/2013  Outpatient Primary MD for the patient is [name], MD   With History of -  Past Medical History  Diagnosis Date  . MI (myocardial infarction)   . Hypertension   . Arthritis   . Coronary atherosclerosis of native coronary artery   . Acute kidney failure, unspecified       Past Surgical History  Procedure Laterality Date  . Foot surgery    . Coronary artery bypass graft  1998    x3  . Hiatal hernia surgery      in for   Chief Complaint  Patient presents with  . Chest Pain  . Shortness of Breath     HPI  Jay Mclean  is a 67 y.o. male,With history of CAD status post CABG in 1998, History of upper GI bleed due to peptic ulcer disease early this year was treated at [facility] hospital,Chronic exertional shortness of breath not on home oxygen, hypertension, arthritis was doing well until about 3-4 days ago when he started experiencing profuse diarrhea, he does not remember eating any bad foods, no exposure to antibiotics recently, no abdominal pain mild cramping at times, no blood in stool or black colored stool, for the last day or so he has experienced a mild dry cough Denies any fever chills however he did have slight worsening in his shortness of breath, he called 911 due to his above dictated symptoms, in the ER he was found to have acute renal failure, profound metabolic acidosis, severe hypotension, ICU team was consulted who saw the patient and requested hospital is to admit. A d-dimer level was obtained in the ER which was elevated due to his acute gastroenteritis infection.   Patient currently does not have a toxic appearance, he is mentating well and answered all questions, he is in no acute distress, his ABGs do not  reveal any oxygenation issues, his pulse ox is about 100% on 2 L nasal cannula, his blood pressures have improved after several liters of IV fluid bolus.    Review of Systems    In addition to the HPI above,  No Fever-chills, No Headache, No changes with Vision or hearing, No problems swallowing food or Liquids, No Chest pain, dry Cough & mild Shortness of Breath, No Abdominal pain, No Nausea or Vommitting, + diarrhea No Blood in stool or Urine, No dysuria, No new skin rashes or bruises, No new joints pains-aches,  No new weakness, tingling, numbness in any extremity, No recent weight gain or loss, No polyuria, polydypsia or polyphagia, No significant Mental Stressors.  A full 10 point Review of Systems was done, except as stated above, all other Review of Systems were negative.   Social History History  Substance Use Topics  . Smoking status: Former Smoker quit> 13yrs ago    Types: Cigarettes    Quit date: 09/01/1982  .  Smokeless tobacco: No   . Alcohol Use: No      Family History No colon CA  Prior to Admission medications   Medication Sig Start Date End Date Taking? Authorizing Provider  ALPRAZolam (XANAX) 0.25 MG tablet Take 1 tablet (0.25 mg total) by mouth at bedtime as needed for sleep or anxiety. 02/25/13  Yes Mae Shelda Altes, FNP  fish oil-omega-3 fatty acids 1000 MG capsule Take 1 g by mouth daily.   Yes Historical Provider, MD  hydrochlorothiazide (HYDRODIURIL) 25 MG tablet Take 1 tablet (25 mg total) by mouth daily. 03/22/13  Yes Laqueta Linden, MD  HYDROcodone-acetaminophen (NORCO/VICODIN) 5-325 MG per tablet Take 1 tablet by mouth daily as needed for pain. 04/01/13  Yes Mae Shelda Altes, FNP  omeprazole (PRILOSEC) 40 MG capsule Take 40 mg by mouth daily as needed (for acid reflux).  11/04/12  Yes Historical Provider, MD    Allergies  Allergen Reactions  . Ambien [Zolpidem Tartrate] Other (See Comments)    Sleep walking and amnesia  . Aspirin      REACTION: GI BLEED  . Ibuprofen     REACTION: GI BLEED    Physical Exam  Vitals  Blood pressure 110/41, pulse 111, temperature 98.2 F (36.8 C), temperature source Oral, resp. rate 18, SpO2 100.00%.   1. General middle aged white male lying in bed in NAD,     2. Normal affect and insight, Not Suicidal or Homicidal, Awake Alert, Oriented X 3.  3. No F.N deficits, ALL C.Nerves Intact, Strength 5/5 all 4 extremities, Sensation intact all 4 extremities, Plantars down going.  4. Ears and Eyes appear Normal, Conjunctivae clear, PERRLA. Moist Oral Mucosa.  5. Supple Neck, No JVD, No cervical lymphadenopathy appriciated, No Carotid Bruits.  6. Symmetrical Chest wall movement, Good air movement bilaterally, CTAB.  7. RRR, No Gallops, Rubs or Murmurs, No Parasternal Heave.  8. Positive Bowel Sounds, Abdomen Soft, Non tender, No organomegaly appriciated,No rebound -guarding or rigidity.  9.  No Cyanosis, Normal Skin Turgor, No Skin Rash or Bruise.  10. Good muscle tone,  joints appear normal , no effusions, Normal ROM.  11. No Palpable Lymph Nodes in Neck or Axillae     Data Review  CBC  Recent Labs Lab 04/17/13 0346  WBC 7.2  HGB 9.4*  HCT 28.0*  PLT 63*  MCV 97.2  MCH 32.6  MCHC 33.6  RDW 16.4*  LYMPHSABS 0.5*  MONOABS 0.2  EOSABS 0.0  BASOSABS 0.0   ------------------------------------------------------------------------------------------------------------------  Chemistries   Recent Labs Lab 04/17/13 0346  NA 141  K 4.1  CL 112  CO2 14*  GLUCOSE 98  BUN 52*  CREATININE 3.05*  CALCIUM 8.5  AST 27  ALT 16  ALKPHOS 88  BILITOT 1.4*   ------------------------------------------------------------------------------------------------------------------ CrCl is unknown because both a height and weight (above a minimum accepted value) are required for this  calculation. ------------------------------------------------------------------------------------------------------------------ No results found for this basename: TSH, T4TOTAL, FREET3, T3FREE, THYROIDAB,  in the last 72 hours   Coagulation profile No results found for this basename: INR, PROTIME,  in the last 168 hours -------------------------------------------------------------------------------------------------------------------  Recent Labs  04/17/13 0346  DDIMER 2.45*   -------------------------------------------------------------------------------------------------------------------  Cardiac Enzymes  Recent Labs Lab 04/17/13 0346  TROPONINI <0.30   ------------------------------------------------------------------------------------------------------------------ No components found with this basename: POCBNP,    ---------------------------------------------------------------------------------------------------------------  Urinalysis No results found for this basename: colorurine, appearanceur, labspec, phurine, glucoseu, hgbur, bilirubinur, ketonesur, proteinur, urobilinogen, nitrite, leukocytesur    ----------------------------------------------------------------------------------------------------------------  Imaging results:  Dg Chest Port 1 View  04/17/2013   *RADIOLOGY REPORT*  Clinical Data: Dyspnea  PORTABLE CHEST - 1 VIEW  Comparison: Prior radiograph from 12/07/2012  Findings: Median sternotomy wires are again noted.  Cardiac and mediastinal silhouettes are within normal limits.  The lungs are normally inflated.  No focal infiltrate to suggest an acute infectious pneumonitis is identified.  There is no pulmonary edema or pleural effusion.  No pneumothorax.  Bony thorax is intact.  IMPRESSION: No acute cardiopulmonary process.   Original Report Authenticated By: Rise Mu, M.D.     Ordered baseline EKG    Assessment & Plan   1.Gastroenteritis  causing profuse diarrhea, dehydration, Hypotension,acute renal failure, severe metabolic acidosis due to acute renal failure and diarrhea - patient will be admitted to step down for close monitoring, GI pathogen panel and stool cultures will be obtained, aggressive IV fluids bolus and then maintenance IV bicarbonate, urine electrolytes, discontinue home dose diuretics, continue on Flagyl which was started in the ER for common GI pathogens, and do not think he has C. Difficile, monitor closely in step-down.   2. Acute renal failure with metabolic acidosis due to #1 above. avoid diuretics and nephrotoxins, monitor with IV fluids, baseline creatinine appears to be around 1 in our chart from year ago, urine and lites will be obtained, serial creatinine monitoring.   3.Thrombocytopenia - Likely due to infectious etiology from #1, currently no active bleeding, will avoid heparin Lovenox, repeat CBC in the morning.   4. History of CAD status post CABG. No acute issues is chest pain-free, we'll obtain baseline EKG and and troponins.   5. Acute on chronic shortness of breath. Stable on 2 L nasal cannula oxygen with pulse ox 100, ABG stable with stable PaO2,I think his shortness of breath is due to increased work of breathing brought on by severe metabolic acidosis, with supportive care we will monitor, his chest x-ray stable.   6. History of upper GI bleed. Stable H&H, by mouth PPI will be continued, will monitor CBC on daily basis.     DVT Prophylaxis  SCDs   AM Labs Ordered, also please review Full Orders  Family Communication: Admission, patients condition and plan of care including tests being ordered have been discussed with the patient and full who indicate understanding and agree with the plan and Code Status.  Code Status Full  Likely DC to  Home  Time spent in minutes : 35  Condition GUARDED   Leroy Sea M.D on 04/17/2013 at 7:23 AM  Between 7am to 7pm - Pager -  [phone]  After 7pm go to [email or web address] - password [email or web address]  And look for the night coverage person covering me after hours  [facility] Office  [phone]

## 2013-04-17 NOTE — ED Notes (Signed)
Nurse unable to take report at the time. To call back.  

## 2013-04-17 NOTE — ED Notes (Signed)
0700  Received the blood from the blood bank and spiked the blood preparing the blood to be given and then tried to scan the blood and it had not been released to be used as there was a miscommunication for the service and the MD Preston Fleeting only wanted the blood to be held not received from the blood bank and spiked to be given.  No blood was given and blood bank was called and the blood is being returned to the blood bank.

## 2013-04-17 NOTE — ED Notes (Signed)
1610  Pt arrives to the ER via EMS with C/O CP that pt states started at 1800 and progressively got worse.  Upon EMS arrival pt was in SVT and received 6, 12 and 12 of Adenosine with no help and then the pt received 10mg  Cardizem with no help and then 10 more of Cardizem and finally dropped HR to 120's from 180's

## 2013-04-17 NOTE — ED Provider Notes (Signed)
CSN: [id number]     Arrival date & time 04/17/13  0301 History     First MD Initiated Contact with Patient 04/17/13 [phone]     Chief Complaint  Patient presents with  . Chest Pain  . Shortness of Breath   (Consider location/radiation/quality/duration/timing/severity/associated sxs/prior Treatment) Patient is a 67 y.o. male presenting with chest pain and shortness of breath. The history is provided by the patient.  Chest Pain Associated symptoms: shortness of breath   Shortness of Breath Associated symptoms: chest pain   He had onset at about 5 PM of watery diarrhea and dyspnea. He has also had some mild, intermittent chest pain. Chest pain, when present, is described as a dull pain with a fluttering feeling which he rated 4/10. He has no pain currently. He states that he took 2 ibuprofen tablets and following that his diarrhea has subsided but his dyspnea has continued. He denies nausea or vomiting. He denies fever, chills, sweats. He has swelling in his legs which has been present for a long time. He states that he was evaluated last month for possible DVT and had negative Doppler tests. He does have a history of coronary artery bypass which was more than 15 years ago. He is not currently seeing a cardiologist.  Past Medical History  Diagnosis Date  . MI (myocardial infarction)   . Hypertension   . Arthritis   . Coronary atherosclerosis of native coronary artery   . Acute kidney failure, unspecified    Past Surgical History  Procedure Laterality Date  . Foot surgery    . Coronary artery bypass graft  1998    x3  . Hiatal hernia surgery     No family history on file. History  Substance Use Topics  . Smoking status: Former Smoker    Types: Cigarettes    Quit date: 09/01/1982  . Smokeless tobacco: Not on file  . Alcohol Use: No    Review of Systems  Respiratory: Positive for shortness of breath.   Cardiovascular: Positive for chest pain.  All other systems reviewed and are  negative.    Allergies  Ambien; Aspirin; and Ibuprofen  Home Medications   Current Outpatient Rx  Name  Route  Sig  Dispense  Refill  . ALPRAZolam (XANAX) 0.25 MG tablet   Oral   Take 1 tablet (0.25 mg total) by mouth at bedtime as needed for sleep or anxiety.   30 tablet   0   . fish oil-omega-3 fatty acids 1000 MG capsule   Oral   Take 1 g by mouth daily.         . hydrochlorothiazide (HYDRODIURIL) 25 MG tablet   Oral   Take 1 tablet (25 mg total) by mouth daily.   90 tablet   3   . HYDROcodone-acetaminophen (NORCO/VICODIN) 5-325 MG per tablet   Oral   Take 1 tablet by mouth daily as needed for pain.   30 tablet   0   . omeprazole (PRILOSEC) 40 MG capsule   Oral   Take 40 mg by mouth daily as needed (for acid reflux).           BP 106/45  Pulse 111  Temp(Src) 98.2 F (36.8 C) (Oral)  Resp 18  SpO2 100% Physical Exam  Nursing note and vitals reviewed.  67 year old male, resting comfortably and in no acute distress. Vital signs are significant for tachycardia with heart rate 111. Oxygen saturation is 100%, which is normal. Head is normocephalic  and atraumatic. PERRLA, EOMI. Oropharynx is clear. Neck is nontender and supple without adenopathy or JVD. Back is nontender and there is no CVA tenderness. Lungs are clear without rales, wheezes, or rhonchi. Chest is nontender. Heart has regular rate and rhythm without murmur. Abdomen is soft, flat, nontender without masses or hepatosplenomegaly and peristalsis is normoactive. Extremities have 2+ edema, full range of motion is present. Skin is warm and dry without rash. Neurologic: Mental status is normal, cranial nerves are intact, there are no motor or sensory deficits.  ED Course   Procedures (including critical care time)  Results for orders placed during the hospital encounter of 04/17/13  CBC WITH DIFFERENTIAL      Result Value Range   WBC 7.2  4.0 - 10.5 K/uL   RBC 2.88 (*) 4.22 - 5.81 MIL/uL    Hemoglobin 9.4 (*) 13.0 - 17.0 g/dL   HCT 40.9 (*) 81.1 - 91.4 %   MCV 97.2  78.0 - 100.0 fL   MCH 32.6  26.0 - 34.0 pg   MCHC 33.6  30.0 - 36.0 g/dL   RDW 78.2 (*) 95.6 - 21.3 %   Platelets 63 (*) 150 - 400 K/uL   Neutrophils Relative % 91 (*) 43 - 77 %   Neutro Abs 6.5  1.7 - 7.7 K/uL   Lymphocytes Relative 7 (*) 12 - 46 %   Lymphs Abs 0.5 (*) 0.7 - 4.0 K/uL   Monocytes Relative 2 (*) 3 - 12 %   Monocytes Absolute 0.2  0.1 - 1.0 K/uL   Eosinophils Relative 0  0 - 5 %   Eosinophils Absolute 0.0  0.0 - 0.7 K/uL   Basophils Relative 0  0 - 1 %   Basophils Absolute 0.0  0.0 - 0.1 K/uL  COMPREHENSIVE METABOLIC PANEL      Result Value Range   Sodium 141  135 - 145 mEq/L   Potassium 4.1  3.5 - 5.1 mEq/L   Chloride 112  96 - 112 mEq/L   CO2 14 (*) 19 - 32 mEq/L   Glucose, Bld 98  70 - 99 mg/dL   BUN 52 (*) 6 - 23 mg/dL   Creatinine, Ser 0.86 (*) 0.50 - 1.35 mg/dL   Calcium 8.5  8.4 - 57.8 mg/dL   Total Protein 5.9 (*) 6.0 - 8.3 g/dL   Albumin 2.6 (*) 3.5 - 5.2 g/dL   AST 27  0 - 37 U/L   ALT 16  0 - 53 U/L   Alkaline Phosphatase 88  39 - 117 U/L   Total Bilirubin 1.4 (*) 0.3 - 1.2 mg/dL   GFR calc non Af Amer 19 (*) >90 mL/min   GFR calc Af Amer 22 (*) >90 mL/min  PRO B NATRIURETIC PEPTIDE      Result Value Range   Pro B Natriuretic peptide (BNP) 394.1 (*) 0 - 125 pg/mL  TROPONIN I      Result Value Range   Troponin I <0.30  <0.30 ng/mL  D-DIMER, QUANTITATIVE      Result Value Range   D-Dimer, Quant 2.45 (*) 0.00 - 0.48 ug/mL-FEU  OCCULT BLOOD, POC DEVICE      Result Value Range   Fecal Occult Bld POSITIVE (*) NEGATIVE  CG4 I-STAT (LACTIC ACID)      Result Value Range   Lactic Acid, Venous 4.06 (*) 0.5 - 2.2 mmol/L  POCT I-STAT 3, BLOOD GAS (G3+)      Result Value Range   pH, Arterial  7.376  7.350 - 7.450   pCO2 arterial 22.6 (*) 35.0 - 45.0 mmHg   pO2, Arterial 129.0 (*) 80.0 - 100.0 mmHg   Bicarbonate 13.3 (*) 20.0 - 24.0 mEq/L   TCO2 14  0 - 100 mmol/L   O2  Saturation 99.0     Acid-base deficit 11.0 (*) 0.0 - 2.0 mmol/L   Patient temperature 98.2 F     Collection site RADIAL, ALLEN'S TEST ACCEPTABLE     Drawn by RT     Sample type ARTERIAL    TYPE AND SCREEN      Result Value Range   ABO/RH(D) A POS     Antibody Screen NEG     Sample Expiration 04/20/2013     Unit Number [id number]     Blood Component Type RED CELLS,LR     Unit division 00     Status of Unit ALLOCATED     Transfusion Status OK TO TRANSFUSE     Crossmatch Result Compatible     Unit Number [id number]     Blood Component Type RED CELLS,LR     Unit division 00     Status of Unit ISSUED     Transfusion Status OK TO TRANSFUSE     Crossmatch Result Compatible    PREPARE RBC (CROSSMATCH)      Result Value Range   Order Confirmation ORDER PROCESSED BY BLOOD BANK    ABO/RH      Result Value Range   ABO/RH(D) A POS     Dg Chest Port 1 View  04/17/2013   *RADIOLOGY REPORT*  Clinical Data: Dyspnea  PORTABLE CHEST - 1 VIEW  Comparison: Prior radiograph from 12/07/2012  Findings: Median sternotomy wires are again noted.  Cardiac and mediastinal silhouettes are within normal limits.  The lungs are normally inflated.  No focal infiltrate to suggest an acute infectious pneumonitis is identified.  There is no pulmonary edema or pleural effusion.  No pneumothorax.  Bony thorax is intact.  IMPRESSION: No acute cardiopulmonary process.   Original Report Authenticated By: Rise Mu, M.D.      Date: 04/17/2013  Rate: 123  Rhythm: sinus tachycardia  QRS Axis: normal  Intervals: QT prolonged  ST/T Wave abnormalities: normal  Conduction Disutrbances:none  Narrative Interpretation: Sinus tachycardia, borderline prolonged QT interval. When compared with ECG of 03/23/2013, heart rate has increased, QT interval has increased.  Old EKG Reviewed: changes noted   1. Acute renal failure   2. Thrombocytopenia   3. Elevated lactic acid level   4. Metabolic acidosis   5.  Anemia   6. Hematest positive stools   7. Hypotension    CRITICAL CARE Performed by: HYQMV,HQION Total critical care time: 90 minutes Critical care time was exclusive of separately billable procedures and treating other patients. Critical care was necessary to treat or prevent imminent or life-threatening deterioration. Critical care was time spent personally by me on the following activities: development of treatment plan with patient and/or surrogate as well as nursing, discussions with consultants, evaluation of patient's response to treatment, examination of patient, obtaining history from patient or surrogate, ordering and performing treatments and interventions, ordering and review of laboratory studies, ordering and review of radiographic studies, pulse oximetry and re-evaluation of patient's condition.  MDM  Dyspnea which seems most likely to be related to CHF. However, with tachycardia, need to consider possibility of pulmonary embolism. D-dimer will be obtained as well as beta natruretic protein and chest x-ray and ECG.  Workup is positive  for many things. He is noted to have significant anemia and that he now gives a history that he had to have blood transfusion because of a bleeding ulcers about 6 months ago. Rectal exam is done and shows that he does have Hemoccult-positive stool that is not grossly melanotic. He is noted to have thrombocytopenia as well as renal failure and marked metabolic acidosis with a normal anion gap. D-dimer is elevated which may be partly related to his renal failure but CT angiogram cannot be done because of renal insufficiency. He became hypotensive and was given aggressive IV hydration but blood pressure did not respond adequately. In spite of this, he maintained normal mentation his stated that he felt fine. Blood pressure dropped as low as 65 systolic. Case is discussed with Dr. Tyson Alias of pulmonary critical care service who agrees to admit the patient.  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 Also, there is no report of red cell fragments seen on his peripheral smear for his CBC.  Dione Booze, MD 04/17/13 [phone]

## 2013-04-17 NOTE — ED Notes (Signed)
Pt aware urine sample is needed. Given a urinal but unable to go at this time.

## 2013-04-18 ENCOUNTER — Encounter (HOSPITAL_COMMUNITY): Payer: Self-pay

## 2013-04-18 DIAGNOSIS — E872 Acidosis: Secondary | ICD-10-CM | POA: Diagnosis present

## 2013-04-18 DIAGNOSIS — R7989 Other specified abnormal findings of blood chemistry: Secondary | ICD-10-CM | POA: Diagnosis present

## 2013-04-18 LAB — COMPREHENSIVE METABOLIC PANEL
ALT: 15 U/L (ref 0–53)
AST: 23 U/L (ref 0–37)
Albumin: 2.2 g/dL — ABNORMAL LOW (ref 3.5–5.2)
Alkaline Phosphatase: 69 U/L (ref 39–117)
BUN: 61 mg/dL — ABNORMAL HIGH (ref 6–23)
Chloride: 106 mEq/L (ref 96–112)
Potassium: 4.7 mEq/L (ref 3.5–5.1)
Sodium: 137 mEq/L (ref 135–145)
Total Bilirubin: 1.3 mg/dL — ABNORMAL HIGH (ref 0.3–1.2)

## 2013-04-18 LAB — BASIC METABOLIC PANEL
BUN: 64 mg/dL — ABNORMAL HIGH (ref 6–23)
CO2: 17 mEq/L — ABNORMAL LOW (ref 19–32)
Calcium: 7.9 mg/dL — ABNORMAL LOW (ref 8.4–10.5)
GFR calc non Af Amer: 16 mL/min — ABNORMAL LOW (ref >90)
Glucose, Bld: 110 mg/dL — ABNORMAL HIGH (ref 70–99)

## 2013-04-18 LAB — CBC
HCT: 24.9 % — ABNORMAL LOW (ref 39.0–52.0)
MCH: 32.3 pg (ref 26.0–34.0)
MCHC: 33.3 g/dL (ref 30.0–36.0)
MCV: 96.9 fL (ref 78.0–100.0)
Platelets: 40 10*3/uL — ABNORMAL LOW (ref 150–400)
RDW: 16.6 % — ABNORMAL HIGH (ref 11.5–15.5)

## 2013-04-18 MED ORDER — SODIUM CHLORIDE 0.9 % IV BOLUS (SEPSIS)
500.0000 mL | Freq: Once | INTRAVENOUS | Status: AC
  Administered 2013-04-18: 500 mL via INTRAVENOUS

## 2013-04-18 MED ORDER — FAMOTIDINE IN NACL 20-0.9 MG/50ML-% IV SOLN: 20.0000 mg | Freq: Two times a day (BID) | INTRAVENOUS | Status: DC

## 2013-04-18 MED ORDER — FAMOTIDINE IN NACL 20-0.9 MG/50ML-% IV SOLN
20.0000 mg | INTRAVENOUS | Status: DC
  Administered 2013-04-18: 20 mg via INTRAVENOUS
  Filled 2013-04-18: qty 50

## 2013-04-18 MED ORDER — FAMOTIDINE 20 MG PO TABS
20.0000 mg | ORAL_TABLET | Freq: Every day | ORAL | Status: DC
  Administered 2013-04-18 – 2013-04-25 (×8): 20 mg via ORAL
  Filled 2013-04-18 (×10): qty 1

## 2013-04-18 MED ORDER — SODIUM CHLORIDE 0.9 % IV SOLN
INTRAVENOUS | Status: DC
  Administered 2013-04-18 – 2013-04-19 (×2): via INTRAVENOUS

## 2013-04-18 MED ORDER — DEXTROSE 5 % IV SOLN
1.0000 g | INTRAVENOUS | Status: DC
  Administered 2013-04-18 – 2013-04-19 (×2): 1 g via INTRAVENOUS
  Filled 2013-04-18 (×3): qty 10

## 2013-04-18 NOTE — Progress Notes (Signed)
[facility] Progress Note Halfway TEAM 1 - Stepdown ICU Team   Jay Mclean Sr. ZOX:[id number] DOB: 05-Mar-1946 DOA: 04/17/2013 PCP: [name], MD  Brief narrative: 67 year old male patient with known CAD as well as prior upper GI bleed from peptic ulcer disease. Has chronic exertional shortness of breath not requiring home oxygen. Also has history of hypertension. Was in usual state of health until 3-4 days prior to presenting to the emergency department when he started experiencing profuse diarrhea. Because of the significant GI symptoms he called 911. Evaluation in the ER revealed the patient had acute renal failure with profound metabolic acidosis and severe hypotension. He was initially evaluated by critical care medicine but the patient stabilized enough that he did not require ICU admission and was subsequently admitted by the Hospitalist service  Assessment/Plan:    Acute renal failure -due to severe GI losses with hypotension -has worsened slightly since admit -was on HCTZ pre admit -follow lytes and hydrate - avoid nephrotoxins    SIRS (systemic inflammatory response syndrome) - probable UTI -due to hypoperfusion from profound DH and resultant ARF -treat underlying causes -no definitive bacterial or other infectious etiology yet -UA abnormal - pyelonephritis could also explain diarrhea - follow cx     Dehydration due to diarrhea -diarrhea has resolved for now -dc Protonix since r/o C.diff in process -likely secondary to pyelonephritis    E. Coli UTI/pyelonephritis -sensitivities pending -added Rocephin to Flagyl    Thrombocytopenia -due gram neg rod infection -Nadir thus far 40,000- today -would avoid heparin products for now    Hypertension -BP soft so hold home meds    Increased anion gap metabolic acidosis -from [facility] and renal failure with low perfusion    Coronary atherosclerosis of native coronary artery -stable -had Myoview in March 31, 2013 [facility]     Positive D dimer -suspect due to ARF  -reported with chronic dyspnea and also remains tachycardic -may warrant additional evaluation if tachycarida does not improve w/ fluid  -will check B LE venous duplex -cannot pursue CT angio given ARF  DVT prophylaxis: SCDs Code Status: Full Family Communication: Patient Disposition Plan/Expected LOS: Transfer to telemetry  Consultants: None  Procedures: None  Antibiotics: Flagyl 8/17 >>> Rocephin 8/18 >>  HPI/Subjective: Alert and sitting up in chair. Denies any further recurrence of diarrhea. Denies shortness of breath while seated upright but complains of shortness of breath when laying flat. No chest pain. Also describes what sounds like reflux symptoms which are also chronic.  Objective: Blood pressure 103/33, pulse 104, temperature 98 F (36.7 C), temperature source Oral, resp. rate 21, height 5\' 8"  (1.727 m), weight 108.8 kg (239 lb 13.8 oz), SpO2 99.00%.  Intake/Output Summary (Last 24 hours) at 04/18/13 1029 Last data filed at 04/18/13 0800  Gross per 24 hour  Intake   3020 ml  Output    375 ml  Net   2645 ml    Exam: General: No acute respiratory distress Lungs: Coarse to auscultation bilaterally without wheezes or crackles, RA Cardiovascular: Mildly tachycardic without murmur gallop or rub normal S1 and S2, no peripheral edema or JVD Abdomen: Nontender, nondistended, soft, bowel sounds positive, no rebound, no ascites, no appreciable mass Musculoskeletal: No significant cyanosis, clubbing of bilateral lower extremities Neurological: Alert and oriented x 3, moves all extremities x 4 without focal neurological deficits, CN 2-12 intact  Scheduled Meds:  Scheduled Meds: . famotidine (PEPCID) IV  20 mg Intravenous Q24H  . metronidazole  500 mg Intravenous Q8H  . sodium chloride  500 mL Intravenous Once  . sodium chloride  3 mL Intravenous Q12H   Data Reviewed: Basic Metabolic  Panel:  Recent Labs Lab 04/17/13 0346 04/18/13 0441  NA 141 137  K 4.1 4.7  CL 112 106  CO2 14* 16*  GLUCOSE 98 92  BUN 52* 61*  CREATININE 3.05* 3.58*  CALCIUM 8.5 7.6*   Liver Function Tests:  Recent Labs Lab 04/17/13 0346 04/18/13 0441  AST 27 23  ALT 16 15  ALKPHOS 88 69  BILITOT 1.4* 1.3*  PROT 5.9* 5.4*  ALBUMIN 2.6* 2.2*   CBC:  Recent Labs Lab 04/17/13 0346 04/18/13 0441  WBC 7.2 9.4  NEUTROABS 6.5  --   HGB 9.4* 8.3*  HCT 28.0* 24.9*  MCV 97.2 96.9  PLT 63* 40*   Cardiac Enzymes:  Recent Labs Lab 04/17/13 0346 04/17/13 0955 04/17/13 1450 04/17/13 1920  TROPONINI <0.30 <0.30 <0.30 <0.30   BNP (last 3 results)  Recent Labs  04/17/13 0346  PROBNP 394.1*    Recent Results (from the past 240 hour(s))  MRSA PCR SCREENING     Status: None   Collection Time    04/17/13  9:47 AM      Result Value Range Status   MRSA by PCR NEGATIVE  NEGATIVE Final   Comment:            The GeneXpert MRSA Assay (FDA     approved for NASAL specimens     only), is one component of a     comprehensive MRSA colonization     surveillance program. It is not     intended to diagnose MRSA     infection nor to guide or     monitor treatment for     MRSA infections.     Studies:  Recent x-ray studies have been reviewed in detail by the Attending Physician    Junious Silk, ANP [facility] Office  [phone] Pager [phone]  **If unable to reach the above provider after paging please contact the Flow Manager @ [phone]  On-Call/Text Page:      [email or web address]      password [email or web address]  If 7PM-7AM, please contact night-coverage [email or web address] Password [email or web address] 04/18/2013, 10:29 AM   LOS: 1 day   I have personally examined this patient and reviewed the entire database. I have reviewed the above note, made any necessary editorial changes, and agree with its content.  Lonia Blood, MD [facility]

## 2013-04-19 ENCOUNTER — Inpatient Hospital Stay (HOSPITAL_COMMUNITY): Payer: Medicare Other

## 2013-04-19 DIAGNOSIS — R791 Abnormal coagulation profile: Secondary | ICD-10-CM

## 2013-04-19 DIAGNOSIS — N19 Unspecified kidney failure: Secondary | ICD-10-CM

## 2013-04-19 DIAGNOSIS — I959 Hypotension, unspecified: Secondary | ICD-10-CM

## 2013-04-19 LAB — CBC
HCT: 23.8 % — ABNORMAL LOW (ref 39.0–52.0)
Hemoglobin: 8.1 g/dL — ABNORMAL LOW (ref 13.0–17.0)
MCH: 32.7 pg (ref 26.0–34.0)
MCHC: 34 g/dL (ref 30.0–36.0)
RDW: 17 % — ABNORMAL HIGH (ref 11.5–15.5)

## 2013-04-19 LAB — URINALYSIS, ROUTINE W REFLEX MICROSCOPIC
Glucose, UA: NEGATIVE mg/dL
Ketones, ur: 15 mg/dL — AB
Protein, ur: 100 mg/dL — AB
Urobilinogen, UA: 1 mg/dL (ref 0.0–1.0)

## 2013-04-19 LAB — URINE CULTURE: Colony Count: >=100000

## 2013-04-19 LAB — BASIC METABOLIC PANEL
BUN: 73 mg/dL — ABNORMAL HIGH (ref 6–23)
Creatinine, Ser: 3.86 mg/dL — ABNORMAL HIGH (ref 0.50–1.35)
GFR calc Af Amer: 17 mL/min — ABNORMAL LOW (ref >90)
GFR calc non Af Amer: 15 mL/min — ABNORMAL LOW (ref >90)
Glucose, Bld: 62 mg/dL — ABNORMAL LOW (ref 70–99)

## 2013-04-19 LAB — TECHNOLOGIST SMEAR REVIEW: Tech Review: NONE SEEN

## 2013-04-19 LAB — URINE MICROSCOPIC-ADD ON

## 2013-04-19 MED ORDER — SODIUM POLYSTYRENE SULFONATE 15 GM/60ML PO SUSP: 15.0000 g | Freq: Once | ORAL | Status: DC

## 2013-04-19 MED ORDER — SODIUM BICARBONATE 8.4 % IV SOLN
INTRAVENOUS | Status: DC
  Administered 2013-04-19: 12:00:00 via INTRAVENOUS
  Filled 2013-04-19 (×6): qty 150

## 2013-04-19 MED ORDER — PANTOPRAZOLE SODIUM 40 MG PO TBEC
40.0000 mg | DELAYED_RELEASE_TABLET | Freq: Two times a day (BID) | ORAL | Status: DC
  Administered 2013-04-19 – 2013-04-26 (×15): 40 mg via ORAL
  Filled 2013-04-19 (×15): qty 1

## 2013-04-19 NOTE — Progress Notes (Signed)
[facility] PROGRESS NOTE  Jay Mclean QIO:[id number] DOB: 07/18/1946 DOA: 04/17/2013 PCP: [name], MD  Brief narrative:  67 year old male patient with known CAD as well as prior upper GI bleed from peptic ulcer disease. Has chronic exertional shortness of breath not requiring home oxygen. Also has history of hypertension. Was in usual state of health until 3-4 days prior to presenting to the emergency department when he started experiencing profuse diarrhea. Because of the significant GI symptoms he called 911. Evaluation in the ER revealed the patient had acute renal failure with profound metabolic acidosis and severe hypotension. He was initially evaluated by critical care medicine but the patient stabilized enough that he did not require ICU admission and was subsequently admitted by the Hospitalist service.  He was found to have E. Coli UTI.     Assessment/Plan  Acute renal failure most likely due to ATN from septic and hypovolemic shock, recorded only 375 mL of urine yesterday but not strictly recorded.  DDx includes HUS/TTP, although considerably less likely given age and lack of fever, confusion.  Patient with mild SOB and extremity swelling - Strict I/O and daily weights -  Continue IVF but change to D5 sodium bicarb -  RUS -  UA  -  Consider nephrology consult if not improving.  Severe sepsis due to UTI/pyelonephritis, blood pressures improved  Dehydration due to diarrhea, patient appears clinically hypervolemic.  C. Diff less likely as no BMs in 4 days.    E. Coli UTI/pyelonephritis,  Day 2 ceftriaxone  Thrombocytopenia, stable at 40.  May be due to GNR/mild DIC which would explain elevated D-dimer.  TTP less likely as no confusion and platelet count is generally < 20 with TTP.  HUS less likely in adults. -  Check smear and fibrinogen   -  Trend platelets  Hypertension blood pressures improving. -  Continue to hold home blood pressure  medications.  Increased anion gap metabolic acidosis.  Was previously like to due to lactic acidosis, ketosis, and renal failure.  Likely due to renal failure predominantly at this time. -  Will start IV fluids with sodium bicarbonate  Coronary atherosclerosis of native coronary artery, chest pain free -had My[facility]iew in March 31, 2013 at [facility]   Positive D dimer.  Patient is still tachycardic and SOB, but with rales.  -  Consider VQ scan -  eval for DIC above.    Hx of bleeding ulcers -  Restart PPI  Diet:  Renal diet Access:  PIV IVF:  D5 143meq/L sodium bicarb at 12ml/h Proph:  SCDs  Code Status: full Family Communication: spoke with patient alone Disposition Plan: pending improvement in kidney function, further evaluation for thrombocytopenia/anemia   Consultants:  None  Procedures:  None  Antibiotics:  Flagyl 8/17 >>> 8/18 Rocephin 8/18 >>   HPI/Subjective:  States he feels somewhat better.  Still has swelling and SOB, but overall strength is improved.  Making urine, although not recorded strictly.  Last BM was 4 days ago.    Objective: Filed Vitals:   04/18/13 1212 04/18/13 1405 04/18/13 2020 04/19/13 0453  BP:  161/67 120/56 114/71  Pulse:  120 112 109  Temp: 97.8 F (36.6 C) 98.4 F (36.9 C) 98.5 F (36.9 C) 97.6 F (36.4 C)  TempSrc: Oral  Oral Oral  Resp:  22 20 20   Height:  5\' 8"  (1.727 m)    Weight:  106.595 kg (235 lb)  109.77 kg (242 lb)  SpO2:  99%  98% 95%   No intake or output data in the 24 hours ending 04/19/13 1140 Filed Weights   04/18/13 0400 04/18/13 1405 04/19/13 0453  Weight: 108.8 kg (239 lb 13.8 oz) 106.595 kg (235 lb) 109.77 kg (242 lb)    Exam:   General:  CM, No acute distress  HEENT:  NCAT, MMM  Cardiovascular:  RRR, nl S1, S2 no mrg, 2+ pulses, warm extremities  Respiratory:  Rales at the right base without wheezes or rhonchi, no increased WOB  Abdomen:  NABS, soft, NT/ND  MSK:  Normal tone and bulk, 2+  hard pitting/firm edema to the knees bilateral lower extremities.   Neuro:  Grossly intact  Data Reviewed: Basic Metabolic Panel:  Recent Labs Lab 04/17/13 0346 04/18/13 0441 04/18/13 1129 04/19/13 0650  NA 141 137 138 136  K 4.1 4.7 5.2* 5.2*  CL 112 106 107 106  CO2 14* 16* 17* 13*  GLUCOSE 98 92 110* 62*  BUN 52* 61* 64* 73*  CREATININE 3.05* 3.58* 3.63* 3.86*  CALCIUM 8.5 7.6* 7.9* 8.1*   Liver Function Tests:  Recent Labs Lab 04/17/13 0346 04/18/13 0441  AST 27 23  ALT 16 15  ALKPHOS 88 69  BILITOT 1.4* 1.3*  PROT 5.9* 5.4*  ALBUMIN 2.6* 2.2*   No results found for this basename: LIPASE, AMYLASE,  in the last 168 hours No results found for this basename: AMMONIA,  in the last 168 hours CBC:  Recent Labs Lab 04/17/13 0346 04/18/13 0441 04/19/13 0650  WBC 7.2 9.4 7.3  NEUTROABS 6.5  --   --   HGB 9.4* 8.3* 8.1*  HCT 28.0* 24.9* 23.8*  MCV 97.2 96.9 96.0  PLT 63* 40* 40*   Cardiac Enzymes:  Recent Labs Lab 04/17/13 0346 04/17/13 0955 04/17/13 1450 04/17/13 1920  TROPONINI <0.30 <0.30 <0.30 <0.30   BNP (last 3 results)  Recent Labs  04/17/13 0346  PROBNP 394.1*   CBG: No results found for this basename: GLUCAP,  in the last 168 hours  Recent Results (from the past 240 hour(s))  URINE CULTURE     Status: None   Collection Time    04/17/13  7:32 AM      Result Value Range Status   Specimen Description URINE, RANDOM   Final   Special Requests NONE   Final   Culture  Setup Time     Final   Value: 04/17/2013 08:48     Performed at [facility] Count     Final   Value: >=100,000 COLONIES/ML     Performed at [facility]   Culture     Final   Value: ESCHERICHIA COLI     Performed at [facility]   Report Status 04/19/2013 FINAL   Final   Organism ID, Bacteria ESCHERICHIA COLI   Final  MRSA PCR SCREENING     Status: None   Collection Time    04/17/13  9:47 AM      Result Value Range Status   MRSA by  PCR NEGATIVE  NEGATIVE Final   Comment:            The GeneXpert MRSA Assay (FDA     approved for NASAL specimens     only), is one component of a     comprehensive MRSA colonization     surveillance program. It is not     intended to diagnose MRSA     infection nor to guide or  monitor treatment for     MRSA infections.     Studies: No results found.  Scheduled Meds: . cefTRIAXone (ROCEPHIN)  IV  1 g Intravenous Q24H  . famotidine  20 mg Oral QHS  . sodium chloride  3 mL Intravenous Q12H   Continuous Infusions: .  sodium bicarbonate  infusion 1000 mL      Active Problems:   Acute renal failure   Thrombocytopenia   Hypertension   Coronary atherosclerosis of native coronary artery   SIRS (systemic inflammatory response syndrome)   Gastroenteritis   Dehydration   Increased anion gap metabolic acidosis   Positive D dimer    Time spent: 30 min    Norman Bier, [facility]  [facility] Pager [phone]. If 7PM-7AM, please contact night-coverage at [email or web address], password [facility] 04/19/2013, 11:40 AM  LOS: 2 days

## 2013-04-19 NOTE — Progress Notes (Signed)
*  PRELIMINARY RESULTS* Vascular Ultrasound Bilateral lower extremity venous duplex has been completed.  Preliminary findings: negative for DVT and baker's cyst.   Farrel Demark, RDMS, RVT  04/19/2013, 2:51 PM

## 2013-04-20 ENCOUNTER — Encounter (HOSPITAL_COMMUNITY): Payer: Self-pay | Admitting: Nurse Practitioner

## 2013-04-20 DIAGNOSIS — I509 Heart failure, unspecified: Secondary | ICD-10-CM

## 2013-04-20 DIAGNOSIS — I369 Nonrheumatic tricuspid valve disorder, unspecified: Secondary | ICD-10-CM

## 2013-04-20 DIAGNOSIS — D696 Thrombocytopenia, unspecified: Secondary | ICD-10-CM

## 2013-04-20 LAB — URINE CULTURE: Culture: NO GROWTH

## 2013-04-20 LAB — URINALYSIS, ROUTINE W REFLEX MICROSCOPIC
Glucose, UA: NEGATIVE mg/dL
Ketones, ur: NEGATIVE mg/dL
pH: 5.5 (ref 5.0–8.0)

## 2013-04-20 LAB — BASIC METABOLIC PANEL
Calcium: 8.3 mg/dL — ABNORMAL LOW (ref 8.4–10.5)
GFR calc non Af Amer: 14 mL/min — ABNORMAL LOW (ref >90)
Glucose, Bld: 94 mg/dL (ref 70–99)
Sodium: 138 mEq/L (ref 135–145)

## 2013-04-20 LAB — CREATININE, URINE, RANDOM: Creatinine, Urine: 97.73 mg/dL

## 2013-04-20 LAB — CBC
MCH: 33.1 pg (ref 26.0–34.0)
Platelets: 48 10*3/uL — ABNORMAL LOW (ref 150–400)
RBC: 2.48 MIL/uL — ABNORMAL LOW (ref 4.22–5.81)

## 2013-04-20 LAB — DIC (DISSEMINATED INTRAVASCULAR COAGULATION)PANEL
D-Dimer, Quant: 3.93 ug/mL-FEU — ABNORMAL HIGH (ref 0.00–0.48)
Fibrinogen: 536 mg/dL — ABNORMAL HIGH (ref 204–475)
INR: 1.62 — ABNORMAL HIGH (ref 0.00–1.49)
Platelets: 46 10*3/uL — ABNORMAL LOW (ref 150–400)

## 2013-04-20 LAB — URINE MICROSCOPIC-ADD ON

## 2013-04-20 MED ORDER — FUROSEMIDE 10 MG/ML IJ SOLN
40.0000 mg | Freq: Once | INTRAMUSCULAR | Status: DC
  Filled 2013-04-20: qty 4

## 2013-04-20 MED ORDER — PERFLUTREN LIPID MICROSPHERE
1.0000 mL | INTRAVENOUS | Status: AC | PRN
  Administered 2013-04-20: 2 mL via INTRAVENOUS
  Administered 2013-04-20: 3 mL via INTRAVENOUS
  Filled 2013-04-20: qty 10

## 2013-04-20 MED ORDER — DEXTROSE 5 % IV SOLN
2.0000 g | INTRAVENOUS | Status: DC
  Administered 2013-04-20 – 2013-04-23 (×4): 2 g via INTRAVENOUS
  Filled 2013-04-20 (×4): qty 2

## 2013-04-20 NOTE — Consult Note (Signed)
[facility] CONSULT NOTE    Date: 04/20/2013                  Patient Name:  Jay KUENZI Sr.  MRN: [id number]  DOB: 1946/02/14  Age / Sex: 67 y.o., male         PCP: [name], MD  -[facility] [phone]               Service Requesting Consult: [facility]                  Reason for Consult: Acute renal failure             History of Present Illness: Jay Mclean is a  67 y.o. male with a PMHx of CAD s/p CABG (1998), ?MI vs angina (per pt), AKI, hiatal hernia, upper GIB 2/2 peptic ulcer, chronic dyspnea, HTN, arthritis who was admitted to [facility] on 04/17/2013 for evaluation of chest pain and worsening shortness of breath. He also had "dysentery" x 1 day prior to admission with nausea, vomiting, diarrhea, decreased oral intake, mild dry cough.  He denies sick contacts.  He tried Imodium AD with relief of diarrhea.    Renal was consulted for worsening renal failure.    Medications: Outpatient medications: Prescriptions prior to admission  Medication Sig Dispense Refill  . ALPRAZolam (XANAX) 0.25 MG tablet Take 1 tablet (0.25 mg total) by mouth at bedtime as needed for sleep or anxiety.  30 tablet  0  . fish oil-omega-3 fatty acids 1000 MG capsule Take 1 g by mouth daily.      . hydrochlorothiazide (HYDRODIURIL) 25 MG tablet Take 1 tablet (25 mg total) by mouth daily.  90 tablet  3  . HYDROcodone-acetaminophen (NORCO/VICODIN) 5-325 MG per tablet Take 1 tablet by mouth daily as needed for pain.  30 tablet  0  . omeprazole (PRILOSEC) 40 MG capsule Take 40 mg by mouth daily as needed (for acid reflux).         Current medications: Current Facility-Administered Medications  Medication Dose Route Frequency Provider Last Rate Last Dose  . albuterol (PROVENTIL) (5 MG/ML) 0.5% nebulizer solution 2.5 mg  2.5 mg Nebulization Q2H PRN Leroy Sea, MD   2.5 mg at 04/17/13 1932  . ALPRAZolam Prudy Feeler) tablet 0.25 mg  0.25 mg Oral QHS PRN  Leroy Sea, MD   0.25 mg at 04/19/13 2210  . cefTRIAXone (ROCEPHIN) 2 g in dextrose 5 % 50 mL IVPB  2 g Intravenous Q24H Catarina Hartshorn, MD      . famotidine (PEPCID) tablet 20 mg  20 mg Oral QHS Lonia Blood, MD   20 mg at 04/19/13 2124  . guaiFENesin-dextromethorphan (ROBITUSSIN DM) 100-10 MG/5ML syrup 5 mL  5 mL Oral Q4H PRN Leroy Sea, MD      . HYDROcodone-acetaminophen (NORCO/VICODIN) 5-325 MG per tablet 1-2 tablet  1-2 tablet Oral Q4H PRN Leroy Sea, MD   2 tablet at 04/20/13 1023  . ondansetron (ZOFRAN) tablet 4 mg  4 mg Oral Q6H PRN Leroy Sea, MD       Or  . ondansetron (ZOFRAN) injection 4 mg  4 mg Intravenous Q6H PRN Leroy Sea, MD      . pantoprazole (PROTONIX) EC tablet 40 mg  40 mg Oral BID Renae Fickle, MD   40 mg at 04/20/13 0944  . sodium chloride 0.9 % injection 3 mL  3 mL Intravenous Q12H Prashant K  Thedore Mins, MD   3 mL at 04/18/13 1000      Allergies: Allergies  Allergen Reactions  . Ambien [Zolpidem Tartrate] Other (See Comments)    Sleep walking and amnesia  . Aspirin     REACTION: GI BLEED  . Ibuprofen     REACTION: GI BLEED      Past Medical History: Past Medical History  Diagnosis Date  . Hypertension   . Arthritis   . Coronary atherosclerosis of native coronary artery     a. s/p remote MI;  b. 10/1996 s/p CABG  . Acute kidney failure, unspecified   . Shortness of breath   . H/O hiatal hernia   . Bilateral lower extremity edema     a. chronic  . Hyperlipidemia   . Peptic ulcer disease   . GIB (gastrointestinal bleeding)     a. h/o GIB on nsaids/asa - 2009.Follows with GI in [place]   . Chronic pain     feet and ankles      Past Surgical History: Past Surgical History  Procedure Laterality Date  . Foot surgery      s/p fall where fx'ed subtaler ankles b/l  . Coronary artery bypass graft  1998    x3  . Hiatal hernia surgery       Family History: Family History  Problem Relation Age of Onset  .  Diabetes Father   . Renal Disease Father   Father was on HD   Social History: History   Social History  . Marital Status: Widowed    Spouse Name: N/A    Number of Children: N/A  . Years of Education: N/A   Occupational History  . Not on file.   Social History Main Topics  . Smoking status: Former Smoker    Types: Cigarettes    Quit date: 09/01/1982  . Smokeless tobacco: Not on file  . Alcohol Use: No  . Drug Use: No  . Sexual Activity: Not on file   Other Topics Concern  . Not on file   Social History Narrative   3 kids live in [place]   Lives alone, widowed    Former smoker    Denies alcohol, drugs,    Used to work as Pharmacist, community      Review of Systems: General: +weight gain (normal weight 230-240), denies fever/chills  Cardiac: denies chest pain Pulm: worsening sob x 3 days prior to admission now labored but better. SOB with exertion walking 12-13 steps, +orthopenea, +nocturnal dyspnea  Abd/GU: +distension, +red stools 3-4 months ago when took Aspirin/Ibuprofen, denies difficulty urinating, denies hematuria, resolved nausea/vomiting/diarrhea Ext: +lower extremity edema  Vital Signs: Blood pressure 95/54, pulse 100, temperature 97.6 F (36.4 C), temperature source Oral, resp. rate 17, height 5\' 8"  (1.727 m), weight 245 lb (111.131 kg), SpO2 97.00%.  Weight trends: Filed Weights   04/18/13 1405 04/19/13 0453 04/20/13 0429  Weight: 235 lb (106.595 kg) 242 lb (109.77 kg) 245 lb (111.131 kg)    Physical Exam: General: Vital signs reviewed and noted. Well-developed, obese, in no acute distress; alert, appropriate and cooperative throughout examination.  Head: Normocephalic, atraumatic.  Eyes:  No signs of anemia or jaundince.  Lungs:  B/l crackles   Heart: tachycardia. S1 and S2 normal without gallop, murmur, or rubs.  Abdomen:  BS normoactive. Soft, distended, non-tender.  +splenomegally  Extremities: 2+ pitting edema b/l  Neurologic: A&O X3, grossly  neurologically intact   Skin: No visible rashes, scars.  Lab results: Basic Metabolic Panel:  Recent Labs Lab 04/18/13 1129 04/19/13 0650 04/20/13 0610  NA 138 136 138  K 5.2* 5.2* 4.8  CL 107 106 105  CO2 17* 13* 21  GLUCOSE 110* 62* 94  BUN 64* 73* 81*  CREATININE 3.63* 3.86* 3.98*  CALCIUM 7.9* 8.1* 8.3*    Liver Function Tests:  Recent Labs Lab 04/17/13 0346 04/18/13 0441  AST 27 23  ALT 16 15  ALKPHOS 88 69  BILITOT 1.4* 1.3*  PROT 5.9* 5.4*  ALBUMIN 2.6* 2.2*   CBC:  Recent Labs Lab 04/17/13 0346 04/18/13 0441 04/19/13 0650 04/20/13 0610  WBC 7.2 9.4 7.3 7.0  NEUTROABS 6.5  --   --   --   HGB 9.4* 8.3* 8.1* 8.2*  HCT 28.0* 24.9* 23.8* 23.3*  MCV 97.2 96.9 96.0 94.0  PLT 63* 40* 40* 48*    Cardiac Enzymes:  Recent Labs Lab 04/17/13 0346 04/17/13 0955 04/17/13 1450 04/17/13 1920  TROPONINI <0.30 <0.30 <0.30 <0.30    Microbiology: Results for orders placed during the hospital encounter of 04/17/13  URINE CULTURE     Status: None   Collection Time    04/17/13  7:32 AM      Result Value Range Status   Specimen Description URINE, RANDOM   Final   Special Requests NONE   Final   Culture  Setup Time     Final   Value: 04/17/2013 08:48     Performed at [facility] Count     Final   Value: >=100,000 COLONIES/ML     Performed at [facility]   Culture     Final   Value: ESCHERICHIA COLI     Performed at [facility]   Report Status 04/19/2013 FINAL   Final   Organism ID, Bacteria ESCHERICHIA COLI   Final  MRSA PCR SCREENING     Status: None   Collection Time    04/17/13  9:47 AM      Result Value Range Status   MRSA by PCR NEGATIVE  NEGATIVE Final   Comment:            The GeneXpert MRSA Assay (FDA     approved for NASAL specimens     only), is one component of a     comprehensive MRSA colonization     surveillance program. It is not     intended to diagnose MRSA     infection nor to guide or      monitor treatment for     MRSA infections.    Coagulation Studies:  Recent Labs  04/18/13 0441  LABPROT 19.7*  INR 1.72*    Urinalysis:  Recent Labs  04/19/13 1306  COLORURINE ORANGE*  LABSPEC 1.020  PHURINE 5.0  GLUCOSEU NEGATIVE  HGBUR LARGE*  BILIRUBINUR SMALL*  KETONESUR 15*  PROTEINUR 100*  UROBILINOGEN 1.0  NITRITE NEGATIVE  LEUKOCYTESUR MODERATE*    Results for HAZIM, TREADWAY SR. (MRN [id number]) as of 04/20/2013 13:27  Ref. Range 04/19/2013 13:06  Hgb urine dipstick Latest Range: NEGATIVE  LARGE (A)  WBC, UA Latest Range: <3 WBC/hpf TOO NUMEROUS TO COUNT  RBC / HPF Latest Range: <3 RBC/hpf TOO NUMEROUS TO COUNT  Squamous Epithelial / LPF Latest Range: RARE  FEW (A)  Bacteria, UA Latest Range: RARE  MANY (A)   Results for BRAXDEN, LOVERING SR. (MRN [id number]) as of 04/20/2013 13:27  Ref. Range 04/17/2013 07:32  Color, Urine  Latest Range: YELLOW  AMBER (A)  APPearance Latest Range: CLEAR  CLOUDY (A)  Specific Gravity, Urine Latest Range: 1.005-1.030  1.019  pH Latest Range: 5.0-8.0  5.0  Glucose Latest Range: NEGATIVE mg/dL NEGATIVE  Bilirubin Urine Latest Range: NEGATIVE  SMALL (A)  Ketones, ur Latest Range: NEGATIVE mg/dL 15 (A)  Protein Latest Range: NEGATIVE mg/dL NEGATIVE  Urobilinogen, UA Latest Range: 0.0-1.0 mg/dL 0.2  Nitrite Latest Range: NEGATIVE  POSITIVE (A)  Leukocytes, UA Latest Range: NEGATIVE  MODERATE (A)  Hgb urine dipstick Latest Range: NEGATIVE  MODERATE (A)  WBC, UA Latest Range: <3 WBC/hpf 7-10  RBC / HPF Latest Range: <3 RBC/hpf 3-6  Squamous Epithelial / LPF Latest Range: RARE  FEW (A)  Bacteria, UA Latest Range: RARE  MANY (A)  Osmolality, Ur Latest Range: [phone] mOsm/kg 461   Results for THOR, NANNINI SR. (MRN [id number]) as of 04/20/2013 13:27  Ref. Range 04/17/2013 07:05  Sodium, Ur No range found 34  Creatinine, Urine No range found 399.54   FENA on 8/17 was 0.2% Imaging: US Renal  04/19/2013    *RADIOLOGY REPORT*  Clinical Data:  Acute renal injury  RENAL/URINARY TRACT ULTRASOUND COMPLETE  Comparison:  None  Findings:  Right Kidney:  Measures 10.7 cm.  Normal in size and parenchymal echogenicity.  No evidence of mass or hydronephrosis.  Left Kidney:  Measures 10.3 cm.Normal in size and parenchymal echogenicity.  No evidence of mass or hydronephrosis.  Bladder:  Appears normal for degree of bladder distention.  Other:  The spleen is enlarged measuring 18.5 cm in length.  The splenic volume equals 1228.3 ml.  IMPRESSION:  1.  Normal appearance of the kidneys. 2.  Splenomegaly.   Original Report Authenticated By: Signa Kell, M.D.   Dg Chest Port 1 View  04/19/2013   *RADIOLOGY REPORT*  Clinical Data: Shortness of breath.  Rales on physical exam.  PORTABLE CHEST - 1 VIEW  Comparison: 04/17/2013  Findings: Decreased lung volumes seen since prior exam.  Persistent cardiomegaly noted.  Increased diffuse interstitial infiltrates, suspicious for diffuse interstitial edema.  No focal consolidation identified.  Prior median sternotomy noted.  IMPRESSION: Cardiomegaly and increased diffuse interstitial infiltrates, consistent with congestive heart failure.   Original Report Authenticated By: Myles Rosenthal, M.D.     Assessment & Plan: Jay Mclean is a 67 y.o. yo male with a PMHX of CAD s/p CABG (1998), ? MI vs angina (per pt), AKI, hiatal hernia, upper GIB 2/2 peptic ulcer, chronic dyspnea, HTN, arthritis who was admitted to [facility] on 04/17/2013 for evaluation of chest pain and worsening shortness of breath found to be in SVT in the ED 8/17. He also had nausea, vomiting, diarrhea, mild dry cough, prior to admission.  On 04/17/2013 with patient was found to have acute renal failure, severe metabolic acidosis with gap, hypotension, E.coli UTI, supratherapeutic INR, anemia/thrombocytopenia, splenomegaly.  He is now fluid overloaded with persistent hypotension and concern for worsening renal failure, possible DIC.    #Acute renal failure versus subacute  -Likely due to ischemic ATN, hypotension due to sepsis, volume depletion due to nausea/vomiting/diarrhea prior to admission (FENA was 0.2% on 8/17).  He was not taking HCTZ prior to admission for 3-4 months prior to admission. Less likely HUS/TTP.  Creatinine was 1.12 in 04/06/10.  On admission Creatinine was 3.05 with trend up to 3.98 on 8/20.   -Avoid nephrotoxic drugs  -Renal US with splenomegaly and normal kidneys -pending SPEP, UPEP, complement levels, ASO, ANA, ANCA, Urine (Na +  Cr), repeat UA -pending records from PCP  #Severe metabolic acidosis with AG noted  -AG on admission was 15-->15-->14-->17-->12 (8/20).  He had lactic acidosis on 04/17/13 of 4.06 which could be etiology of acidosis as well as diarrhea which would cause non AG acidosis.  Infection and renal failure can also cause metabolic acidosis.  Bicarb as low as 13 8/19 but normal today.  -He was given x 1 on 8/17 then transitioned to gtt 75 cc/hr 8/17. Then to Nabicarb 8/19 75 cc/hr (8/19-8/20) now off  #Concern for DIC -Etiology could be related to liver disease (i.e infection) or E.Coli.  Fibrinogen 499 (high) which is normally low in DIC, D dimer 2.45 (high), PT 19.7 high, INR 1.72 (high) -recommend getting complete DIC labs though patient has not had schistocytes/fever.  -pending ADAMTS 13 to w/u for TTP  #Elevated INR -? If patient is vitamin K deficient   #Hypotension with history of HTN -BP as low as 65 systolic on admission per ED notes and ranging 70s-160s/20s-70s though overall soft. BP still soft 95/54 -etiology of hypotension could be related to sepsis, volume depletion, pump failure    #Fluid overload  -Could be secondary to CHF, acute renal failure, liver disease -strict i/o, daily wts  -pending echo, Hepatitis labs -holding Lasix today in setting of hypotension  -consider US abdomen to evaluate liver   #Sepsis secondary to E.coli UTI  -On Rocephin -Pending  repeat urine culture, pending blood cultures to be collected   #Diarrhea -Suspected Gastroenteritis. Diarrhea resolved as of now  -Pending GI pathogens to be collected. Given Flagyl 500 mg q8 8/17-8/18  #Normocytic Anemia  -9.4 on admission decreased to 8.2 today with FOBT + stools  -Trend CBC   #Thrombocytopenia  -Platelets 63 on admission with low of 40 now trending up to 48 on 04/20/13.  No schistocytes noted 8/19 smear. -Patient denies alcohol or other drugs.  Etiology could be due to splenomegaly which needs further w/u.  Etiology also could be due to acute critical illness.  -Trend CBC. Pending Hepatitis panel   #Splenomegaly -patient denies abdominal pain -Etiology could be due to infection (pending Hepatitis labs), CHF, cirrhosis, consider malignancy (CML, CLL, other)  #Anasarca  -pending hepatitis C ab, Hep B sAg -consider abdominal US to w/u possible liver etiology  # FOBT + on 04/17/13  -History of upper GIB due to peptic ulcer disease  -Currently on PPI -will need to f/u with GI outpatient in [place]  #CAD s/p CABG -consider checking lipid panel, Consider BB in the future, statin, and possibly ACEI/ARB once acute issues resolved   #Suspected CHF, unspecified  -CXR 8/19 with diffuse interstitial infiltrates and concern for CHF.   -pending Echo -Cardiology consult pending  #SVT -HR 180s in the ED on 8/17 in the ED. Given Adenosine 6 mg, 12 mg and 12 mg. He also received Cardiazem total 20 mg. Patient still slightly tachycardic  -monitor via tele  #Worsening dyspnea -Primary team considering VQ scan in setting of elevated d dimer. Etiology could be due to CHF though echo pending  -[place] negative for DVT b/l  #F/E/N -IVF now stopped was given 125 cc/hr NS 8/18-8/19.  He was given Na bicarb x 1 on 8/17 then transitioned to gtt 75 cc/hr 8/17-8/20. -Hyperkalemia resolved today likely due to AKI. Trend RFP -Renal diet   #DVT PPX  - scds due to  thrombocytopenia   Desma Maxim PGY 2 [phone] Discussed with attending

## 2013-04-20 NOTE — Progress Notes (Addendum)
I was called by RN to see pt due to SBP in 80s. Repeat automated blood pressure 104/56 while I was in room.  Manual BP 102/52.  Oxygen sat 98%RA.  No distress.  Patient denies fevers, chills, chest pain, shortness of breath at this time.  Hold IV furosemide.  Consult cardiology as pt remains clinically fluid overloaded and BP is limiting diuresis.   Patient also relates history of many months of worsening dyspnea on exertion with increasing abdominal girth and lower extremity edema. He also relates a history of PND. DTat

## 2013-04-20 NOTE — Consult Note (Signed)
I have seen and examined this patient and agree with plan as outlined by Dr. Shirlee Latch.  Pt with possible AKI vs. Subacute as we do not have a baseline.  He presented with volume depletion and low FeNa and was appropriately treated with IVF's however now has anemia, throbocytopenia, hypoalbuminemia, edema, hypotension, and worsening kidney disease.  Urine culture + for E. Coli but also has elevated PT/INR without known h/o liver disease.  Worrisome for ischemic ATN due to urosepsis further complicated by pre-renal factors. HUS-TTP less likely due to lack of schistocytes and abnormal PT/INR.  Will need to further investigate possible liver disease/CMP.  Serologies ordered and will cont to follow closely.  No indication for dialysis at this time. Will cont to follow.  Gautham Hewins A,MD 04/20/2013 3:09 PM

## 2013-04-20 NOTE — Consult Note (Signed)
CARDIOLOGY CONSULT NOTE  Patient ID: Jay Reeves Sr. MRN: [id number], DOB/AGE: 12-29-1945   Admit date: 04/17/2013 Date of Consult: 04/20/2013  Primary Physician: [name], MD Primary Cardiologist: Junius Argyle, MD  Pt. Profile  67 y/o male with h/o CAD s/p CABG who was admitted 8/17 with diarrhea and renal failure with subsequent volume overload, for which we've been asked to eval.  Problem List  Past Medical History  Diagnosis Date  . Hypertension   . Arthritis   . Coronary atherosclerosis of native coronary artery     a. s/p remote MI;  b. 10/1996 s/p CABG  . Acute kidney failure, unspecified   . Shortness of breath   . H/O hiatal hernia   . Bilateral lower extremity edema     a. chronic  . Hyperlipidemia   . Peptic ulcer disease   . GIB (gastrointestinal bleeding)     a. h/o GIB on nsaids/asa - 2009.Follows with GI in [place]   . Chronic pain     feet and ankles     Past Surgical History  Procedure Laterality Date  . Foot surgery      s/p fall where fx'ed subtaler ankles b/l  . Coronary artery bypass graft  1998    x3  . Hiatal hernia surgery      Allergies  Allergies  Allergen Reactions  . Ambien [Zolpidem Tartrate] Other (See Comments)    Sleep walking and amnesia  . Aspirin     REACTION: GI BLEED  . Ibuprofen     REACTION: GI BLEED   HPI   67 y/o male with h/o CAD s/p CABG in 1998.  He was lost to f/u and over the years ("at least 7 or 8") he's had dyspnea on exertion along with chronic bilateral lower ext edema.  He recently reestablished cardiology care in our [place] office with Junius Argyle, MD, and was placed on HCTZ.  An echo and cardiolite were ordered however patient no-showed for both tests (I confirmed this with our [place] office).  He says that he was in his USOH until this past Saturday afternoon/evening (though H&P indicates that Ss began 3-4 days prior to admission), when he began to experience profound diarrhea and dyspnea.   He presented to the [facility] ED where creatinine was elevated @ 3.05, D dimer was elevated @ 2.45, and he was tachycardic.  He was anemic and thrombocytopenic with FOB + stool.  ECG was non-acute and troponin was nl.  UA was abnl and culture subsequently grew out E coli.  He was admitted by IM and home diuretic dose was held.  He was aggressively hydrated and placed on abx for UTI/pyelonephritis.  Despite hydration, renal fxn has continued to worsen and creat is currently 3.98, up from 3.05 on admission (1.12 04/2010).  Renal u/s was normal.  He not had had any diarrhea since Sunday.  Yesterday, he complained of worsening dyspnea.  He is up approx 10 lbs since admission.  CXR yesterday showed pulmonary edema.  This AM, he was placed on IV lasix however prior to ever receiving a dose, he was noted to be  relatively hypotensive.  We've been asked to eval.  He currently complains of mild dyspnea @ rest.  He does not usually have orthopnea but does currently.  He also reports increasing abd girth and lower extremity edema is slightly worse than baseline.  He has not had any chest pain.  Inpatient Medications  . cefTRIAXone (ROCEPHIN)  IV  2 g Intravenous Q24H  . famotidine  20 mg Oral QHS  . pantoprazole  40 mg Oral BID  . sodium chloride  3 mL Intravenous Q12H   Family History Family History  Problem Relation Age of Onset  . Diabetes Father   . Renal Disease Father   . Heart attack Father     died @ 25  . Hip fracture Mother     died following hip fx @ 80.  . Other      siblings alive and well.    Social History History   Social History  . Marital Status: Widowed    Spouse Name: N/A    Number of Children: N/A  . Years of Education: N/A   Occupational History  . Not on file.   Social History Main Topics  . Smoking status: Former Smoker    Types: Cigarettes    Quit date: 09/01/1982  . Smokeless tobacco: Not on file  . Alcohol Use: No     Comment: previously drank a 6 pack of beer/day  but quit in 1984.  . Drug Use: No  . Sexual Activity: Not on file   Other Topics Concern  . Not on file   Social History Narrative   3 kids live in [place]   Lives alone, widowed    Former smoker    Denies alcohol (but used to drink heavily until 1984), drugs,    Used to work as Pharmacist, community     Review of Systems  General:  No chills, fever, night sweats or weight changes.  Cardiovascular:  No chest pain, +++ dyspnea on exertion, +++ edema, +++ orthopnea, +++ increasing abd girth, no early satiety, palpitations, paroxysmal nocturnal dyspnea. Dermatological: No rash, lesions/masses Respiratory: No cough, +++ dyspnea Urologic: No hematuria, dysuria Abdominal:   No nausea, vomiting, +++ diarrhea - resolved Sunday afternoon.  No bright red blood per rectum, melena, or hematemesis Neurologic:  No visual changes, wkns, changes in mental status. All other systems reviewed and are otherwise negative except as noted above.  Physical Exam  Blood pressure 95/54, pulse 100, temperature 97.6 F (36.4 C), temperature source Oral, resp. rate 17, height 5\' 8"  (1.727 m), weight 245 lb (111.131 kg), SpO2 97.00%. BP 106/40 on my check. General: Pleasant, NAD Psych: Normal affect. Neuro: Alert and oriented X 3. Moves all extremities spontaneously. HEENT: Normal  Neck: Supple with bilat bruits vs radiated murmur.  JVP to jaw. Lungs:  Resp regular and unlabored, CTA. Heart: RRR, tachy, no s3, s4, 3/6 sem loudest @ rusb, heard throughout and radiating to neck bilat. Abdomen: protuberant, soft, non-tender, BS + x 4.  Extremities: No clubbing, cyanosis.  2+ bilat LE edema to thighs. DP/PT/Radials 1+ and equal bilaterally.  Labs   Recent Labs  04/17/13 1920  TROPONINI <0.30   Lab Results  Component Value Date   WBC 7.0 04/20/2013   HGB 8.2* 04/20/2013   HCT 23.3* 04/20/2013   MCV 94.0 04/20/2013   PLT 48* 04/20/2013    Recent Labs Lab 04/18/13 0441  04/20/13 0610  NA 137  < > 138   K 4.7  < > 4.8  CL 106  < > 105  CO2 16*  < > 21  BUN 61*  < > 81*  CREATININE 3.58*  < > 3.98*  CALCIUM 7.6*  < > 8.3*  PROT 5.4*  --   --   BILITOT 1.3*  --   --   ALKPHOS 69  --   --  ALT 15  --   --   AST 23  --   --   GLUCOSE 92  < > 94  < > = values in this interval not displayed.  Lab Results  Component Value Date   DDIMER 2.45* 04/17/2013   Lab Results  Component Value Date   LABPROT 19.7* 04/18/2013   Radiology/Studies  US Renal  04/19/2013   *RADIOLOGY REPORT*  Clinical Data:  Acute renal injury  RENAL/URINARY TRACT ULTRASOUND COMPLETE   IMPRESSION:  1.  Normal appearance of the kidneys. 2.  Splenomegaly.   Original Report Authenticated By: Signa Kell, M.D.   Dg Chest Port 1 View  04/19/2013   *RADIOLOGY REPORT*  Clinical Data: Shortness of breath.  Rales on physical exam.  PORTABLE CHEST - 1 VIEW  IMPRESSION: Cardiomegaly and increased diffuse interstitial infiltrates, consistent with congestive heart failure.   Original Report Authenticated By: Myles Rosenthal, M.D.   ECG  Sinus tach, 123, no acute st/t changes.  ASSESSMENT AND PLAN  1.  Acute on chronic diastolic CHF:  In setting of hydration following admission with diarrhea/dehydration and renal failure.  He is significantly volume overloaded and weight is up about 10 lbs since admission.  Net + I/O of ~ 5 L since admission.  Relative hypotension prevented initiation of diuretic today.  BP has since been stable in low 100's.  Nephrology is also seeing and we will defer diuretic dosing to their judgement.  2D echo performed during our interview preliminarily shows nl LV fxn.  Though we had hoped to perform an ischemic evaluation as an outpatient in July, pt did not show for study b/c he didn't think he could afford it.  CE are negative and he has not been having chest pain.  We will not pursue ischemic evaluation at this time and will reconsider in the outpt setting.  2.  Acute renal Failure:  Nephrology on  board.  3.  Anemia:  FOB+ stool.  H/H have been stable.  Consider GI eval.  4.  Thrombocytopenia:  Stable.  Evaluation ongoing.  5.  + D Dimer:  He has chronic dyspnea and sedentary lifestyle.  He is tachycardic in setting of above.  He is not on anticoagulation 2/2 thrombocytopenia.  V:Q being considered.  No CT 2/2 #2.   6.  UTI/Pyelonephritis: Abx per IM.  Signed, Nicolasa Ducking, NP 04/20/2013, 3:06 PM  I have personally seen and examined this patient with Ward Givens, NP. I agree with the assessment and plan as outlined above. He has a history of CAD s/p CABG and has been lost to f/u for years by cardiology. Office visit in [facility] office July 2014 and stress test and echo arranged but he cancelled due to cost. Now admitted with renal failure, UTI, diarrhea. Aggressively volume loaded appropriately and now volume overloaded. He has chronic lower ext edema but clearly pulmonary edema on CXR and increased weight. His BP was too low for diuresis today. Echo shows normal LV systolic function. There is likely a component of diastolic CHF but his volume issue is complex given renal failure. Would defer diuresis to renal team. No ischemic evaluation currently without objective evidence of ischemia (negative troponin, EKG unchanged). Further ischemic evaluation as an outpatient. We will follow with you.   MCALHANY,CHRISTOPHER 4:16 PM 04/20/2013

## 2013-04-20 NOTE — Progress Notes (Signed)
[facility] PROGRESS NOTE  Clois Treanor ZOX:[id number] DOB: 1946/05/14 DOA: 04/17/2013 PCP: [name], MD  Assessment/Plan: Acute renal failure most likely due to ATN from septic and hypovolemic shock, -I/O--question accuracy, nevertheless has low UO -DDx includes HUS/TTP, although considerably less likely given age and lack of fever, confusion.  - Strict I/O and daily weights  - Renal function has worsened on bicarbonate drip - RUS  - UA--rather bland urinalysis at the time of admission -Nephrology consultation Severe sepsis due to UTI/pyelonephritis, blood pressures improved  Dehydration due to diarrhea, -patient appears clinically hypervolemic. C. Diff less likely as no BMs in 4 days.  Acute CHF -Start IV furosemide -Echocardiogram -May need Foley catheter -Discontinue bicarbonate drip -Chest x-ray shows bilateral interstitial infiltrates E. Coli UTI/pyelonephritis -Day 3 ceftriaxone  Thrombocytopenia,  -Patient is a history of previous thrombocytopenia -Renal ultrasound showed splenomegaly which may account for part of his thrombocytopenia -In addition may also be related to sepsis -Await peripheral smear -Hepatitis B surface antigen, hepatitis C antibody -Platelets are stable/improving -mild DIC which would explain elevated D-dimer.  - Fibrinogen 499 - Trend platelets  Hypertension  -blood pressures improving.  - Continue to hold home blood pressure medications.  Increased anion gap metabolic acidosis. Was previously like to due to lactic acidosis, ketosis, and renal failure. Likely due to renal failure predominantly at this time.  Positive D dimer. Patient is still tachycardic and SOB, but with rales.  - Consider VQ scan  - eval for DIC above--suspect patient may have had low-level DIC -In addition to sepsis likely accounted for elevated d-dimer Hx of bleeding ulcers  - Restart PPI  Diet: Renal diet  Access: PIV   Proph: SCDs  Code Status:  full  Family Communication: spoke with patient alone  Disposition Plan: pending improvement in kidney function, further evaluation for thrombocytopenia/anemia  Consultants:  None  Procedures:  None  Antibiotics:  Flagyl 8/17 >>> 8/18  Rocephin 8/18 >>   Family Communication:   Pt at beside Disposition Plan:   Home when medically stable     Procedures/Studies: US Renal  04/19/2013   *RADIOLOGY REPORT*  Clinical Data:  Acute renal injury  RENAL/URINARY TRACT ULTRASOUND COMPLETE  Comparison:  None  Findings:  Right Kidney:  Measures 10.7 cm.  Normal in size and parenchymal echogenicity.  No evidence of mass or hydronephrosis.  Left Kidney:  Measures 10.3 cm.Normal in size and parenchymal echogenicity.  No evidence of mass or hydronephrosis.  Bladder:  Appears normal for degree of bladder distention.  Other:  The spleen is enlarged measuring 18.5 cm in length.  The splenic volume equals 1228.3 ml.  IMPRESSION:  1.  Normal appearance of the kidneys. 2.  Splenomegaly.   Original Report Authenticated By: Signa Kell, M.D.   Dg Chest Port 1 View  04/19/2013   *RADIOLOGY REPORT*  Clinical Data: Shortness of breath.  Rales on physical exam.  PORTABLE CHEST - 1 VIEW  Comparison: 04/17/2013  Findings: Decreased lung volumes seen since prior exam.  Persistent cardiomegaly noted.  Increased diffuse interstitial infiltrates, suspicious for diffuse interstitial edema.  No focal consolidation identified.  Prior median sternotomy noted.  IMPRESSION: Cardiomegaly and increased diffuse interstitial infiltrates, consistent with congestive heart failure.   Original Report Authenticated By: Myles Rosenthal, M.D.   Dg Chest Port 1 View  04/17/2013   *RADIOLOGY REPORT*  Clinical Data: Dyspnea  PORTABLE CHEST - 1 VIEW  Comparison: Prior radiograph from 12/07/2012  Findings: Median sternotomy wires are again noted.  Cardiac and mediastinal silhouettes are within normal limits.  The lungs are normally inflated.  No focal  infiltrate to suggest an acute infectious pneumonitis is identified.  There is no pulmonary edema or pleural effusion.  No pneumothorax.  Bony thorax is intact.  IMPRESSION: No acute cardiopulmonary process.   Original Report Authenticated By: Rise Mu, M.D.         Subjective: Patient continues to complain of some dyspnea. He relates a history of increasing dyspnea on exertion prior to admission. Denies any fevers, chills, chest discomfort, vomiting, diarrhea, dysuria  Objective: Filed Vitals:   04/19/13 0453 04/19/13 1434 04/19/13 2008 04/20/13 0429  BP: 114/71 120/67 129/62 162/72  Pulse: 109 103 107 106  Temp: 97.6 F (36.4 C) 97.9 F (36.6 C) 97.8 F (36.6 C) 98.1 F (36.7 C)  TempSrc: Oral Oral Oral Oral  Resp: 20 18    Height:      Weight: 109.77 kg (242 lb)   111.131 kg (245 lb)  SpO2: 95% 96% 97% 97%    Intake/Output Summary (Last 24 hours) at 04/20/13 1141 Last data filed at 04/20/13 0942  Gross per 24 hour  Intake    240 ml  Output    610 ml  Net   -370 ml   Weight change: 4.536 kg (10 lb) Exam:   General:  Pt is alert, follows commands appropriately, not in acute distress  HEENT: No icterus, No thrush,[place]/AT  Cardiovascular: RRR, S1/S2, no rubs, no gallops  Respiratory: Bibasilar crackles  Abdomen: Soft/+BS, non tender, non distended, no guarding  Extremities: 2+LE edema, No lymphangitis, No petechiae, No rashes, no synovitis  Data Reviewed: Basic Metabolic Panel:  Recent Labs Lab 04/17/13 0346 04/18/13 0441 04/18/13 1129 04/19/13 0650 04/20/13 0610  NA 141 137 138 136 138  K 4.1 4.7 5.2* 5.2* 4.8  CL 112 106 107 106 105  CO2 14* 16* 17* 13* 21  GLUCOSE 98 92 110* 62* 94  BUN 52* 61* 64* 73* 81*  CREATININE 3.05* 3.58* 3.63* 3.86* 3.98*  CALCIUM 8.5 7.6* 7.9* 8.1* 8.3*   Liver Function Tests:  Recent Labs Lab 04/17/13 0346 04/18/13 0441  AST 27 23  ALT 16 15  ALKPHOS 88 69  BILITOT 1.4* 1.3*  PROT 5.9* 5.4*   ALBUMIN 2.6* 2.2*   No results found for this basename: LIPASE, AMYLASE,  in the last 168 hours No results found for this basename: AMMONIA,  in the last 168 hours CBC:  Recent Labs Lab 04/17/13 0346 04/18/13 0441 04/19/13 0650 04/20/13 0610  WBC 7.2 9.4 7.3 7.0  NEUTROABS 6.5  --   --   --   HGB 9.4* 8.3* 8.1* 8.2*  HCT 28.0* 24.9* 23.8* 23.3*  MCV 97.2 96.9 96.0 94.0  PLT 63* 40* 40* 48*   Cardiac Enzymes:  Recent Labs Lab 04/17/13 0346 04/17/13 0955 04/17/13 1450 04/17/13 1920  TROPONINI <0.30 <0.30 <0.30 <0.30   BNP: No components found with this basename: POCBNP,  CBG: No results found for this basename: GLUCAP,  in the last 168 hours  Recent Results (from the past 240 hour(s))  URINE CULTURE     Status: None   Collection Time    04/17/13  7:32 AM      Result Value Range Status   Specimen Description URINE, RANDOM   Final   Special Requests NONE   Final   Culture  Setup Time     Final   Value: 04/17/2013 08:48     Performed  at [facility] Count     Final   Value: >=100,000 COLONIES/ML     Performed at [facility]   Culture     Final   Value: ESCHERICHIA COLI     Performed at [facility]   Report Status 04/19/2013 FINAL   Final   Organism ID, Bacteria ESCHERICHIA COLI   Final  MRSA PCR SCREENING     Status: None   Collection Time    04/17/13  9:47 AM      Result Value Range Status   MRSA by PCR NEGATIVE  NEGATIVE Final   Comment:            The GeneXpert MRSA Assay (FDA     approved for NASAL specimens     only), is one component of a     comprehensive MRSA colonization     surveillance program. It is not     intended to diagnose MRSA     infection nor to guide or     monitor treatment for     MRSA infections.     Scheduled Meds: . cefTRIAXone (ROCEPHIN)  IV  1 g Intravenous Q24H  . famotidine  20 mg Oral QHS  . furosemide  40 mg Intravenous Once  . pantoprazole  40 mg Oral BID  . sodium chloride  3 mL  Intravenous Q12H   Continuous Infusions:    Chasmine Lender, DO  [facility] Pager [phone]  If 7PM-7AM, please contact night-coverage [email or web address] Password [email or web address] 04/20/2013, 11:41 AM   LOS: 3 days

## 2013-04-21 ENCOUNTER — Inpatient Hospital Stay (HOSPITAL_COMMUNITY): Payer: Medicare Other

## 2013-04-21 DIAGNOSIS — I5031 Acute diastolic (congestive) heart failure: Secondary | ICD-10-CM

## 2013-04-21 LAB — TYPE AND SCREEN

## 2013-04-21 LAB — HEPATITIS B SURFACE ANTIGEN: Hepatitis B Surface Ag: NEGATIVE

## 2013-04-21 LAB — MPO/PR-3 (ANCA) ANTIBODIES: Myeloperoxidase Abs: <1 AU/mL (ref <20)

## 2013-04-21 LAB — CBC
HCT: 22.2 % — ABNORMAL LOW (ref 39.0–52.0)
MCH: 32.8 pg (ref 26.0–34.0)
MCHC: 35.6 g/dL (ref 30.0–36.0)
RDW: 17 % — ABNORMAL HIGH (ref 11.5–15.5)

## 2013-04-21 LAB — RENAL FUNCTION PANEL
Albumin: 2 g/dL — ABNORMAL LOW (ref 3.5–5.2)
BUN: 82 mg/dL — ABNORMAL HIGH (ref 6–23)
Calcium: 8.2 mg/dL — ABNORMAL LOW (ref 8.4–10.5)
Creatinine, Ser: 3.78 mg/dL — ABNORMAL HIGH (ref 0.50–1.35)
Glucose, Bld: 93 mg/dL (ref 70–99)
Phosphorus: 3.8 mg/dL (ref 2.3–4.6)
Potassium: 3.9 mEq/L (ref 3.5–5.1)

## 2013-04-21 LAB — URINE CULTURE
Colony Count: NO GROWTH
Culture: NO GROWTH

## 2013-04-21 LAB — ANA: Anti Nuclear Antibody(ANA): NEGATIVE

## 2013-04-21 MED ORDER — FUROSEMIDE 10 MG/ML IJ SOLN
80.0000 mg | Freq: Every day | INTRAMUSCULAR | Status: DC
  Administered 2013-04-21: 80 mg via INTRAVENOUS
  Filled 2013-04-21 (×2): qty 8

## 2013-04-21 NOTE — Progress Notes (Signed)
Pt coughed up dime sized blood clot, pt states he's been doing it for years but has not told his MD. Pt states he doesn't do it often but every now and then. Dr.Tat made aware

## 2013-04-21 NOTE — Consult Note (Signed)
[facility] CONSULT Reason for consult: positive stool and anemia Referring Physician: [facility]. PCP: Dr. Brooke Dare Sr. is an 67 y.o. male.  HPI: patient was hospitalized in March at [facility] with the G.I. bleeding. He apparently had small polyps removed in a small ulcer on EGD and colonoscopy performed at that time. He has been taking some type of medication for*option since. He was admitted here with diarrhea shortness of breath and was found to be an acute renal failure with severe acidosis and was admitted to the ICU. The feeling was that he had an acute gastroenteritis with other problems secondary. His bowel movements are now back to normal. He denies any melena or hematochezia. He was found to be septic due to UTI questionablepyelonephritis. His thrombocytopenic for unclear reasons. Questions of liver disease, TTP or problems have been raised to explain his renal failure. He has a history of CAT and congestive heart failure and has been followed by [facility] cardiology is an outpatient. We were asked to see him cause of Hemoccult positive stools and anemia.  Past Medical History  Diagnosis Date  . Hypertension   . Arthritis   . Coronary atherosclerosis of native coronary artery     a. s/p remote MI;  b. 10/1996 s/p CABG  . Acute kidney failure, unspecified   . Shortness of breath   . H/O hiatal hernia   . Bilateral lower extremity edema     a. chronic  . Hyperlipidemia   . Peptic ulcer disease   . GIB (gastrointestinal bleeding)     a. h/o GIB on nsaids/asa - 2009.Follows with GI in [place]   . Chronic pain     feet and ankles     Past Surgical History  Procedure Laterality Date  . Foot surgery      s/p fall where fx'ed subtaler ankles b/l  . Coronary artery bypass graft  1998    x3  . Hiatal hernia surgery      Family History  Problem Relation Age of Onset  . Diabetes Father   . Renal Disease Father   . Heart  attack Father     died @ 39  . Hip fracture Mother     died following hip fx @ 80.  . Other      siblings alive and well.    Social History:  reports that he quit smoking about 30 years ago. His smoking use included Cigarettes. He smoked 0.00 packs per day. He does not have any smokeless tobacco history on file. He reports that he does not drink alcohol or use illicit drugs.  Allergies:  Allergies  Allergen Reactions  . Ambien [Zolpidem Tartrate] Other (See Comments)    Sleep walking and amnesia  . Aspirin     REACTION: GI BLEED  . Ibuprofen     REACTION: GI BLEED    Medications; . cefTRIAXone (ROCEPHIN)  IV  2 g Intravenous Q24H  . famotidine  20 mg Oral QHS  . furosemide  80 mg Intravenous Daily  . pantoprazole  40 mg Oral BID  . sodium chloride  3 mL Intravenous Q12H   PRN Meds albuterol, ALPRAZolam, guaiFENesin-dextromethorphan, HYDROcodone-acetaminophen, ondansetron (ZOFRAN) IV, ondansetron Results for orders placed during the hospital encounter of 04/17/13 (from the past 48 hour(s))  TECHNOLOGIST SMEAR REVIEW     Status: None   Collection Time    04/19/13  7:34 PM      Result Value Range  Tech Review NO SCHISTOCYTES SEEN     Comment: INCREASED BANDS (>20% BANDS)     PLATELETS APPEAR DECREASED     LARGE PLATELETS PRESENT  FIBRINOGEN     Status: Abnormal   Collection Time    04/19/13  7:34 PM      Result Value Range   Fibrinogen 499 (*) 204 - 475 mg/dL  BASIC METABOLIC PANEL     Status: Abnormal   Collection Time    04/20/13  6:10 AM      Result Value Range   Sodium 138  135 - 145 mEq/L   Potassium 4.8  3.5 - 5.1 mEq/L   Chloride 105  96 - 112 mEq/L   CO2 21  19 - 32 mEq/L   Glucose, Bld 94  70 - 99 mg/dL   BUN 81 (*) 6 - 23 mg/dL   Creatinine, Ser 0.27 (*) 0.50 - 1.35 mg/dL   Calcium 8.3 (*) 8.4 - 10.5 mg/dL   GFR calc non Af Amer 14 (*) >90 mL/min   GFR calc Af Amer 16 (*) >90 mL/min   Comment: (NOTE)     The eGFR has been calculated using the CKD EPI  equation.     This calculation has not been validated in all clinical situations.     eGFR's persistently <90 mL/min signify possible Chronic Kidney     Disease.  CBC     Status: Abnormal   Collection Time    04/20/13  6:10 AM      Result Value Range   WBC 7.0  4.0 - 10.5 K/uL   RBC 2.48 (*) 4.22 - 5.81 MIL/uL   Hemoglobin 8.2 (*) 13.0 - 17.0 g/dL   HCT 25.3 (*) 66.4 - 40.3 %   MCV 94.0  78.0 - 100.0 fL   MCH 33.1  26.0 - 34.0 pg   MCHC 35.2  30.0 - 36.0 g/dL   RDW 47.4 (*) 25.9 - 56.3 %   Platelets 48 (*) 150 - 400 K/uL   Comment: CONSISTENT WITH PREVIOUS RESULT  HEPATITIS B SURFACE ANTIGEN     Status: None   Collection Time    04/20/13  4:18 PM      Result Value Range   Hepatitis B Surface Ag NEGATIVE  NEGATIVE   Comment: Performed at [facility]  HEPATITIS C ANTIBODY     Status: None   Collection Time    04/20/13  4:18 PM      Result Value Range   HCV Ab NEGATIVE  NEGATIVE   Comment: Performed at [facility]  CULTURE, BLOOD (ROUTINE X 2)     Status: None   Collection Time    04/20/13  4:18 PM      Result Value Range   Specimen Description BLOOD RIGHT ARM     Special Requests BOTTLES DRAWN AEROBIC ONLY 5CC     Culture  Setup Time       Value: 04/20/2013 21:17     Performed at [facility]   Culture       Value:        BLOOD CULTURE RECEIVED NO GROWTH TO DATE CULTURE WILL BE HELD FOR 5 DAYS BEFORE ISSUING A FINAL NEGATIVE REPORT     Performed at [facility]   Report Status PENDING    DIC (DISSEMINATED INTRAVASCULAR COAGULATION) PANEL     Status: Abnormal   Collection Time    04/20/13  4:18 PM  Result Value Range   Prothrombin Time 18.8 (*) 11.6 - 15.2 seconds   INR 1.62 (*) 0.00 - 1.49   aPTT 57 (*) 24 - 37 seconds   Comment:            IF BASELINE aPTT IS ELEVATED,     SUGGEST PATIENT RISK ASSESSMENT     BE USED TO DETERMINE APPROPRIATE     ANTICOAGULANT THERAPY.   Fibrinogen 536 (*) 204 - 475 mg/dL   D-Dimer, Quant 1.61  (*) 0.00 - 0.48 ug/mL-FEU   Comment:            AT THE INHOUSE ESTABLISHED CUTOFF     VALUE OF 0.48 ug/mL FEU,     THIS ASSAY HAS BEEN DOCUMENTED     IN THE LITERATURE TO HAVE     A SENSITIVITY AND NEGATIVE     PREDICTIVE VALUE OF AT LEAST     98 TO 99%.  THE TEST RESULT     SHOULD BE CORRELATED WITH     AN ASSESSMENT OF THE CLINICAL     PROBABILITY OF DVT / VTE.   Platelets 46 (*) 150 - 400 K/uL   Comment: CONSISTENT WITH PREVIOUS RESULT   Smear Review NO SCHISTOCYTES SEEN     Comment: LARGE PLATELETS PRESENT     POLYCHROMASIA PRESENT  CULTURE, BLOOD (ROUTINE X 2)     Status: None   Collection Time    04/20/13  4:28 PM      Result Value Range   Specimen Description BLOOD LEFT ARM     Special Requests BOTTLES DRAWN AEROBIC ONLY 8CC     Culture  Setup Time       Value: 04/20/2013 21:17     Performed at [facility]   Culture       Value:        BLOOD CULTURE RECEIVED NO GROWTH TO DATE CULTURE WILL BE HELD FOR 5 DAYS BEFORE ISSUING A FINAL NEGATIVE REPORT     Performed at [facility]   Report Status PENDING    ANA     Status: None   Collection Time    04/20/13  4:28 PM      Result Value Range   ANA NEGATIVE  NEGATIVE   Comment: Performed at [facility]  MPO/PR-3 (ANCA) ANTIBODIES     Status: None   Collection Time    04/20/13  4:28 PM      Result Value Range   Myeloperoxidase Abs <1  <20 AU/mL   Serine Protease 3 1  <20 AU/mL   Comment: Performed at [facility]  C3 COMPLEMENT     Status: Abnormal   Collection Time    04/20/13  4:28 PM      Result Value Range   C3 Complement 59 (*) 90 - 180 mg/dL   Comment: Performed at [facility]  C4 COMPLEMENT     Status: None   Collection Time    04/20/13  4:28 PM      Result Value Range   Complement C4, Body Fluid 22  10 - 40 mg/dL   Comment: Performed at [facility]  IMMUNOFIXATION ELECTROPHORESIS, URINE (WITH TOT PROT)     Status: Abnormal   Collection Time    04/20/13   5:01 PM      Result Value Range   Time RANDOM     Comment: CORRECTED ON 08/21 AT 1733: PREVIOUSLY REPORTED AS URINE, RANDOM, CORRECTED ON  08/20 AT 1731: PREVIOUSLY REPORTED AS 24   Volume, Urine RANDOM     Comment: CORRECTED ON 08/21 AT 1733: PREVIOUSLY REPORTED AS URINE, RANDOM   Total Protein, Urine 66.3     Comment: No established reference range.   Total Protein, Urine-Ur/day NOT CALC  10 - 140 mg/day   Comment: (NOTE)     Total urinary protein is determined by adding the albumin and Kappa     and/or Lambda light chains.  This value may not agree with the total     protein as determined by chemical methods, which characteristically     underestimate urinary light chains.   Albumin, U PENDING     Alpha 1, Urine PENDING     Alpha 2, Urine PENDING     Beta, Urine PENDING     Gamma Globulin, Urine PENDING     Free Kappa Lt Chains,Ur 48.80 (*) 0.14 - 2.42 mg/dL   Free Lt Chn Excr Rate NOT CALC     Free Lambda Lt Chains,Ur 5.62 (*) 0.02 - 0.67 mg/dL   Free Lambda Excretion/Day NOT CALC     Free Kappa/Lambda Ratio 8.68  2.04 - 10.37 ratio   Comment: (NOTE)     Performed at [facility]   Immunofixation, Urine PENDING    URINALYSIS, ROUTINE W REFLEX MICROSCOPIC     Status: Abnormal   Collection Time    04/20/13  5:01 PM      Result Value Range   Color, Urine AMBER (*) YELLOW   Comment: BIOCHEMICALS MAY BE AFFECTED BY COLOR   APPearance HAZY (*) CLEAR   Specific Gravity, Urine 1.017  1.005 - 1.030   pH 5.5  5.0 - 8.0   Glucose, UA NEGATIVE  NEGATIVE mg/dL   Hgb urine dipstick MODERATE (*) NEGATIVE   Bilirubin Urine SMALL (*) NEGATIVE   Ketones, ur NEGATIVE  NEGATIVE mg/dL   Protein, ur 30 (*) NEGATIVE mg/dL   Urobilinogen, UA 1.0  0.0 - 1.0 mg/dL   Nitrite NEGATIVE  NEGATIVE   Leukocytes, UA MODERATE (*) NEGATIVE  SODIUM, URINE, RANDOM     Status: None   Collection Time    04/20/13  5:01 PM      Result Value Range   Sodium, Ur 72    CREATININE, URINE, RANDOM      Status: None   Collection Time    04/20/13  5:01 PM      Result Value Range   Creatinine, Urine 97.73    URINE MICROSCOPIC-ADD ON     Status: Abnormal   Collection Time    04/20/13  5:01 PM      Result Value Range   Squamous Epithelial / LPF FEW (*) RARE   WBC, UA 21-50  <3 WBC/hpf   RBC / HPF 3-6  <3 RBC/hpf   Bacteria, UA FEW (*) RARE   Casts GRANULAR CAST (*) NEGATIVE  CBC     Status: Abnormal   Collection Time    04/21/13  4:00 AM      Result Value Range   WBC 6.0  4.0 - 10.5 K/uL   RBC 2.41 (*) 4.22 - 5.81 MIL/uL   Hemoglobin 7.9 (*) 13.0 - 17.0 g/dL   HCT 40.9 (*) 81.1 - 91.4 %   MCV 92.1  78.0 - 100.0 fL   MCH 32.8  26.0 - 34.0 pg   MCHC 35.6  30.0 - 36.0 g/dL   RDW 78.2 (*) 95.6 - 21.3 %   Platelets  44 (*) 150 - 400 K/uL   Comment: CONSISTENT WITH PREVIOUS RESULT  RENAL FUNCTION PANEL     Status: Abnormal   Collection Time    04/21/13  4:00 AM      Result Value Range   Sodium 136  135 - 145 mEq/L   Potassium 3.9  3.5 - 5.1 mEq/L   Comment: DELTA CHECK NOTED   Chloride 103  96 - 112 mEq/L   CO2 20  19 - 32 mEq/L   Glucose, Bld 93  70 - 99 mg/dL   BUN 82 (*) 6 - 23 mg/dL   Creatinine, Ser 7.84 (*) 0.50 - 1.35 mg/dL   Calcium 8.2 (*) 8.4 - 10.5 mg/dL   Phosphorus 3.8  2.3 - 4.6 mg/dL   Albumin 2.0 (*) 3.5 - 5.2 g/dL   GFR calc non Af Amer 15 (*) >90 mL/min   GFR calc Af Amer 17 (*) >90 mL/min   Comment: (NOTE)     The eGFR has been calculated using the CKD EPI equation.     This calculation has not been validated in all clinical situations.     eGFR's persistently <90 mL/min signify possible Chronic Kidney     Disease.  GAMMA GT     Status: None   Collection Time    04/21/13 11:38 AM      Result Value Range   GGT 50  7 - 51 U/L    US Renal  04/19/2013   *RADIOLOGY REPORT*  Clinical Data:  Acute renal injury  RENAL/URINARY TRACT ULTRASOUND COMPLETE  Comparison:  None  Findings:  Right Kidney:  Measures 10.7 cm.  Normal in size and parenchymal  echogenicity.  No evidence of mass or hydronephrosis.  Left Kidney:  Measures 10.3 cm.Normal in size and parenchymal echogenicity.  No evidence of mass or hydronephrosis.  Bladder:  Appears normal for degree of bladder distention.  Other:  The spleen is enlarged measuring 18.5 cm in length.  The splenic volume equals 1228.3 ml.  IMPRESSION:  1.  Normal appearance of the kidneys. 2.  Splenomegaly.   Original Report Authenticated By: Signa Kell, M.D.               Blood pressure 100/57, pulse 101, temperature 97.3 F (36.3 C), temperature source Oral, resp. rate 18, height 5\' 8"  (1.727 m), weight 110.768 kg (244 lb 3.2 oz), SpO2 97.00%.  Physical exam:   General-- alert pale white male in no distress Heart-- regular rate and rhythm without murmurs are gallops Lungs--I basilar rales Abdomen-- soft and nontender   Assessment: 1. Heme positive stools/anemia. Patient had recent EGD and colonoscopy performed at [facility] revealing an ulcer for which he has been on chronic PPI therapy and small polyps were removed. He is not currently actively bleeding and I'm not sure that repeating EGD and colonoscopy at this time would add much. He said multiple medical illnesses which could certainly account for tracing positive stools and anemia. His fluid balance has also been variable.2 2. Acute renal failure. Cause questionable 3. Recent sepsis due to pyelonephritis 4. History of colon polyps removed several months ago 5. History of recent peptic ulcer has been on chronic PPI therapy  Plan: we will follow him with you. Unless he develops active bleeding I do not think a repeat EGD would be helpful. I would continue to empirically treat with protonix BID.   Austin Herd JR,Shequila Neglia L 04/21/2013, 6:04 PM

## 2013-04-21 NOTE — Progress Notes (Signed)
Subjective: Peeing ok.  Denies dysuria, denies sob, chest pain, dizziness/lightheadedness   RN: Patient was confused overnight but doesn't remember this am.   Objective: Vital signs in last 24 hours: Filed Vitals:   04/20/13 1314 04/20/13 2001 04/21/13 0314 04/21/13 0427  BP: 95/54 95/55  127/54  Pulse: 100 105  100  Temp: 97.6 F (36.4 C) 97.5 F (36.4 C)  97.8 F (36.6 C)  TempSrc: Oral     Resp: 17 18  18   Height:      Weight:   244 lb 3.2 oz (110.768 kg)   SpO2: 97% 96%  99%   Weight change: -12.8 oz (-0.363 kg)  Intake/Output Summary (Last 24 hours) at 04/21/13 0655 Last data filed at 04/20/13 2112  Gross per 24 hour  Intake    723 ml  Output    700 ml  Net     23 ml   Vitals reviewed. General: sitting up in bed, NAD HEENT: [place]/at Cardiac: slightly tachycardic, no rubs, murmurs or gallops Pulm: clear to auscultation bilaterally, no wheezes, rales, or rhonchi Abd: soft, nontender, mildly distended, BS present Ext: warm and well perfused, at least 2+ b/l lower ext. edema Neuro: alert and oriented X3 (person, place, year), grossly neurologically intact   Lab Results: Basic Metabolic Panel:  Recent Labs Lab 04/20/13 0610 04/21/13 0400  NA 138 136  K 4.8 3.9  CL 105 103  CO2 21 20  GLUCOSE 94 93  BUN 81* 82*  CREATININE 3.98* 3.78*  CALCIUM 8.3* 8.2*  PHOS  --  3.8   Liver Function Tests:  Recent Labs Lab 04/17/13 0346 04/18/13 0441 04/21/13 0400  AST 27 23  --   ALT 16 15  --   ALKPHOS 88 69  --   BILITOT 1.4* 1.3*  --   PROT 5.9* 5.4*  --   ALBUMIN 2.6* 2.2* 2.0*  CBC:  Recent Labs Lab 04/17/13 0346  04/20/13 0610 04/20/13 1618 04/21/13 0400  WBC 7.2  < > 7.0  --  6.0  NEUTROABS 6.5  --   --   --   --   HGB 9.4*  < > 8.2*  --  7.9*  HCT 28.0*  < > 23.3*  --  22.2*  MCV 97.2  < > 94.0  --  92.1  PLT 63*  < > 48* 46* 44*  < > = values in this interval not displayed. Cardiac Enzymes:  Recent Labs Lab 04/17/13 0955 04/17/13 1450  04/17/13 1920  TROPONINI <0.30 <0.30 <0.30   BNP:  Recent Labs Lab 04/17/13 0346  PROBNP 394.1*   D-Dimer:  Recent Labs Lab 04/17/13 0346 04/20/13 1618  DDIMER 2.45* 3.93*   Coagulation:  Recent Labs Lab 04/18/13 0441 04/20/13 1618  LABPROT 19.7* 18.8*  INR 1.72* 1.62*   Urinalysis:  Recent Labs Lab 04/19/13 1306 04/20/13 1701  COLORURINE ORANGE* AMBER*  LABSPEC 1.020 1.017  PHURINE 5.0 5.5  GLUCOSEU NEGATIVE NEGATIVE  HGBUR LARGE* MODERATE*  BILIRUBINUR SMALL* SMALL*  KETONESUR 15* NEGATIVE  PROTEINUR 100* 30*  UROBILINOGEN 1.0 1.0  NITRITE NEGATIVE NEGATIVE  LEUKOCYTESUR MODERATE* MODERATE*   8/20 urine Results for MATE, ALEGRIA SR. (MRN [id number]) as of 04/21/2013 06:51  Ref. Range 04/20/2013 17:01  Hgb urine dipstick Latest Range: NEGATIVE  MODERATE (A)  WBC, UA Latest Range: <3 WBC/hpf 21-50  RBC / HPF Latest Range: <3 RBC/hpf 3-6  Squamous Epithelial / LPF Latest Range: RARE  FEW (A)  Bacteria, UA Latest Range:  RARE  FEW (A)  Casts Latest Range: NEGATIVE  GRANULAR CAST (A)   Results for HOGAN, HOOBLER SR. (MRN [id number]) as of 04/21/2013 06:51  Ref. Range 04/20/2013 17:01  Sodium, Ur No range found 72  Creatinine, Urine No range found 97.73   Misc. Labs: Pending urine, blood cultures; SPEP, UPEP, total complement levels, ASO, ANA, ANCA. ADAMS TS 13    Micro Results: Recent Results (from the past 240 hour(s))  URINE CULTURE     Status: None   Collection Time    04/17/13  7:32 AM      Result Value Range Status   Specimen Description URINE, RANDOM   Final   Special Requests NONE   Final   Culture  Setup Time     Final   Value: 04/17/2013 08:48     Performed at [facility] Count     Final   Value: >=100,000 COLONIES/ML     Performed at [facility]   Culture     Final   Value: ESCHERICHIA COLI     Performed at [facility]   Report Status 04/19/2013 FINAL   Final   Organism ID, Bacteria  ESCHERICHIA COLI   Final  MRSA PCR SCREENING     Status: None   Collection Time    04/17/13  9:47 AM      Result Value Range Status   MRSA by PCR NEGATIVE  NEGATIVE Final   Comment:            The GeneXpert MRSA Assay (FDA     approved for NASAL specimens     only), is one component of a     comprehensive MRSA colonization     surveillance program. It is not     intended to diagnose MRSA     infection nor to guide or     monitor treatment for     MRSA infections.  URINE CULTURE     Status: None   Collection Time    04/19/13  1:06 PM      Result Value Range Status   Specimen Description URINE, RANDOM   Final   Special Requests NONE   Final   Culture  Setup Time     Final   Value: 04/19/2013 22:50     Performed at [facility] Count     Final   Value: NO GROWTH     Performed at [facility]   Culture     Final   Value: NO GROWTH     Performed at [facility]   Report Status 04/20/2013 FINAL   Final   Studies/Results: US Renal  04/19/2013   *RADIOLOGY REPORT*  Clinical Data:  Acute renal injury  RENAL/URINARY TRACT ULTRASOUND COMPLETE  Comparison:  None  Findings:  Right Kidney:  Measures 10.7 cm.  Normal in size and parenchymal echogenicity.  No evidence of mass or hydronephrosis.  Left Kidney:  Measures 10.3 cm.Normal in size and parenchymal echogenicity.  No evidence of mass or hydronephrosis.  Bladder:  Appears normal for degree of bladder distention.  Other:  The spleen is enlarged measuring 18.5 cm in length.  The splenic volume equals 1228.3 ml.  IMPRESSION:  1.  Normal appearance of the kidneys. 2.  Splenomegaly.   Original Report Authenticated By: Signa Kell, M.D.   Dg Chest Port 1 View  04/19/2013   *RADIOLOGY REPORT*  Clinical Data: Shortness of breath.  Rales on physical exam.  PORTABLE CHEST - 1 VIEW  Comparison: 04/17/2013  Findings: Decreased lung volumes seen since prior exam.  Persistent cardiomegaly noted.  Increased diffuse  interstitial infiltrates, suspicious for diffuse interstitial edema.  No focal consolidation identified.  Prior median sternotomy noted.  IMPRESSION: Cardiomegaly and increased diffuse interstitial infiltrates, consistent with congestive heart failure.   Original Report Authenticated By: Myles Rosenthal, M.D.   Medications: Scheduled Meds: . cefTRIAXone (ROCEPHIN)  IV  2 g Intravenous Q24H  . famotidine  20 mg Oral QHS  . pantoprazole  40 mg Oral BID  . sodium chloride  3 mL Intravenous Q12H   Continuous Infusions:  PRN Meds:.albuterol, ALPRAZolam, guaiFENesin-dextromethorphan, HYDROcodone-acetaminophen, ondansetron (ZOFRAN) IV, ondansetron Assessment/Plan: Mr. Smithson is a 67 y.o. yo male with a PMHX of CAD s/p CABG (1998), ? MI vs angina (per pt), AKI, hiatal hernia, upper GIB 2/2 peptic ulcer, chronic dyspnea, HTN, arthritis who was admitted to [facility] on 04/17/2013 for evaluation of chest pain and worsening shortness of breath found to be in SVT in the ED 8/17. He also had nausea, vomiting, diarrhea, mild dry cough, prior to admission. On 04/17/2013 with patient was found to have acute renal failure, severe metabolic acidosis with gap, hypotension, E.coli UTI, supratherapeutic INR, anemia/thrombocytopenia, splenomegaly. He is now fluid overloaded with persistent hypotension and concern for worsening renal failure, possible DIC.   #Acute renal failure versus subacute  -Likely due to ischemic ATN, hypotension due to sepsis, volume depletion due to nausea/vomiting/diarrhea prior to admission (FENA was 0.2% on 8/17 and 2.1 on 8/20). He was not taking HCTZ prior to admission for 3-4 months prior to admission. Less likely HUS/TTP. Creatinine was 1.12 in 04/06/10. On admission Creatinine was 3.05 with trend up to 3.98 on 8/20.  -Avoid nephrotoxic drugs  -Renal US with splenomegaly and normal kidneys  -pending SPEP, UPEP, total complement levels, ASO, ANA, ANCA  #Severe metabolic acidosis with AG noted  -AG  on admission was 15-->15-->14-->17-->12-->13 (8/21). He had lactic acidosis on 04/17/13 of 4.06 which could be etiology of acidosis as well as diarrhea which would cause non AG acidosis. Infection and renal failure can also cause metabolic acidosis. Bicarb as low as 13 8/19 but normal today.   #Concern for DIC  -Etiology could be related  E.Coli.  -recommend getting complete DIC labs though patient has not had schistocytes/fever. Result with PT 18.8 (high), INR 1.62 (high), PTT 57 (high), fibrinogen (536 (high), d dimer 3.91 (high), plts 46 w/o schistocytes  -pending ADAMTS 13 to w/u for TTP  -Consider H/O consult  #Elevated INR  -? If patient is vitamin K deficient vs. Cirrhosis   #Hypotension with history of HTN  -BP still soft overnight with BP 80s-90s/40s-70s -etiology of hypotension could be related to sepsis, volume depletion though currently he appears fluid overloaded  -Monitor VS  #Fluid overload  -Could be secondary to diastolic CHF, acute renal failure, liver disease  -strict i/o, daily wts  -8/20 echo w/ EF of 55-60% Hepatitis negative  -Consider diuresis today  -consider US abdomen to evaluate liver   #Sepsis secondary to E.coli UTI  -On Rocephin  -Pending repeat urine culture, pending blood cultures  #Diarrhea/nausea/vomiting, resolved -Suspected Gastroenteritis. Diarrhea resolved as of now  -Pending GI pathogens to be collected. Given Flagyl 500 mg q8 8/17-8/18   #Normocytic Anemia  -9.4 on admission decreased to 7.9 today with FOBT + stools on admission -Trend CBC   #Thrombocytopenia  -Platelets 63 on admission with low of 40  now stable 44 on 04/21/13. No schistocytes noted 8/20 smear.  -Patient denies alcohol or other drugs. Etiology could be due to splenomegaly which needs further w/u. Etiology also could be due to acute critical illness. Negative hepatitis panel  -If etiology not found consider H/O consult  -Trend CBC.  #Splenomegaly  -patient denies  abdominal pain  -Etiology could be due to infection though negative Hepatitis labs, ?diastolic CHF, cirrhosis, consider malignancy (CML, CLL, other)   #Anasarca  -negative hepatitis C ab, Hep B sAg  -consider abdominal US to w/u possible liver etiology (i.e cirrhosis, fatty liver)  # FOBT + on 04/17/13  -History of upper GIB due to peptic ulcer disease most recently 10/2012 when patient was taking NSAIDS, Aspirin per outside records   -Currently on PPI  -will need to f/u with GI outpatient in [place]   #CAD s/p CABG  -consider checking lipid panel, Consider BB in the future, statin, and possibly ACEI/ARB once acute issues resolved   #Suspected CHF, unspecified  -CXR 8/19 with diffuse interstitial infiltrates and concern for CHF.  -nl Echo 8/20. No comment on diastolic dysfunction though question if there is diastolic dysfunction  -Cardiology consulted. He has been lost to f/u outpatient for years.  Rec renal dose diuresis. Rec ischemic evaluation outpatient   #Worsening dyspnea  -stable.  Etiology could be due to diastolic CHF echo with normal EF though does not comment on diastolic dysfunction -[place] negative for DVT b/l   #F/E/N  -Trend RFP  -Renal diet   #DVT PPX  - scds due to thrombocytopenia     LOS: 4 days   Annett Gula [phone] 04/21/2013, 6:55 AM  Discussed with attending

## 2013-04-21 NOTE — Progress Notes (Signed)
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  -C. Diff less likely as no BMs in 5 days.  Acute diastolic CHF  -Start IV furosemide per renal -Echocardiogram--EF 55-60%,no WMA -May need Foley catheter  -Discontinue bicarbonate drip  -Chest x-ray shows bilateral interstitial infiltrates  -cardiology has signed off E. Coli UTI/pyelonephritis  -Day 4 ceftriaxone  Thrombocytopenia/coagulopathy -Patient has a history of previous thrombocytopenia  -likely multifactorial including sepsis, low grade DIC, and underlying liver disease -continue to treat underlying infection -Fibrinogen 536 -Renal ultrasound showed splenomegaly which may account for part of his thrombocytopenia  -Although platelets are low--they have remained stable over past 5 days -Obtain full abdominal ultrasound to image liver -peripheral smear--negative for schistocytes  -Hepatitis B surface antigen, hepatitis C antibody--neg -low grade DIC/sepsis which would explain elevated D-dimer.  -  Fibrinogen 499  - Trend platelets  - recheck coags in am Hypertension -BP dropped with soft BPs 04/20/13 in low 100s on manual cuff  -blood pressures improving 8/21  - Continue to hold home blood pressure medications.  Increased anion gap metabolic acidosis. Was previously like to due to lactic acidosis, ketosis, and renal failure. Likely due to renal failure predominantly at this time.  Positive D dimer. Patient is still tachycardic and SOB, but with rales.  - likely due to sepsis - PE less likely as bilateral LE dopplers are negative (8/19) - low probability of PE (Wells' score= 1.5) -tachycardia has trended down with tx of sepsis Hx of bleeding ulcers/Anemia/positive FOBT -PUD with GIB in March 2014  - Restart PPI  -Hgb drop from 9.4-->7.9 -consulted GI ??Hemoptysis -Nursing reported the patient coughing up blood-tinged sputum -Upon evaluation, patient noted to have epistaxis which is likely to cause of his blood-tinged sputum -Patient further states he has had intermitten epistaxis in past 6 months and NEVER had any hemoptysis if he did not have concurrent epistaxis -repeat CXR in am Diet: Renal diet  Access: PIV  Proph: SCDs  Code Status: full  Family Communication: spoke with patient alone  Disposition Plan: pending improvement in kidney function, further evaluation for thrombocytopenia/anemia  Consultants:  None  Procedures:  None  Antibiotics:  Flagyl 8/17 >>> 8/18  Rocephin 8/18 >>  Family Communication: Pt at beside  Disposition Plan: Home when medically stable      Procedures/Studies: US Renal  04/19/2013   *RADIOLOGY REPORT*  Clinical Data:  Acute renal injury  RENAL/URINARY TRACT ULTRASOUND COMPLETE  Comparison:  None  Findings:  Right Kidney:  Measures 10.7 cm.  Normal in size and parenchymal echogenicity.  No evidence of mass or hydronephrosis.  Left Kidney:  Measures 10.3 cm.Normal in size and parenchymal echogenicity.  No evidence of mass or  hydronephrosis.  Bladder:  Appears normal for degree of bladder distention.  Other:  The spleen is enlarged measuring 18.5 cm in length.  The splenic volume equals 1228.3 ml.  IMPRESSION:  1.  Normal appearance of the kidneys. 2.  Splenomegaly.   Original Report Authenticated By: Signa Kell, M.D.   Dg Chest Port 1 View  04/19/2013   *RADIOLOGY REPORT*  Clinical Data: Shortness of breath.  Rales on physical exam.  PORTABLE CHEST - 1 VIEW  Comparison: 04/17/2013  Findings: Decreased lung volumes seen since prior exam.  Persistent cardiomegaly noted.  Increased diffuse interstitial infiltrates, suspicious for diffuse interstitial edema.  No focal consolidation identified.  Prior median sternotomy noted.  IMPRESSION: Cardiomegaly and increased diffuse interstitial infiltrates, consistent with congestive heart failure.   Original Report Authenticated By: Myles Rosenthal, M.D.   Dg Chest Port 1 View  04/17/2013   *RADIOLOGY REPORT*  Clinical Data: Dyspnea  PORTABLE CHEST - 1 VIEW  Comparison: Prior radiograph from 12/07/2012  Findings: Median sternotomy wires are again noted.  Cardiac and mediastinal silhouettes are within normal limits.  The lungs are normally inflated.  No focal infiltrate to suggest an acute infectious pneumonitis is identified.  There is no pulmonary edema or pleural effusion.  No pneumothorax.  Bony thorax is intact.  IMPRESSION: No acute cardiopulmonary process.   Original Report Authenticated By: Rise Mu, M.D.         Subjective: Patient complains of some dyspnea. Denies any headache, dizziness, fevers, chills, chest discomfort, nausea, vomiting, diarrhea, abdominal pain, dysuria.  Objective: Filed Vitals:   04/21/13 0314 04/21/13 0427 04/21/13 1142 04/21/13 1246  BP:  127/54 119/65 100/57  Pulse:  100  101  Temp:  97.8 F (36.6 C)  97.3 F (36.3 C)  TempSrc:    Oral  Resp:  18    Height:      Weight: 110.768 kg (244 lb 3.2 oz)     SpO2:  99%  97%     Intake/Output Summary (Last 24 hours) at 04/21/13 1413 Last data filed at 04/21/13 1333  Gross per 24 hour  Intake    483 ml  Output   1080 ml  Net   -597 ml   Weight change: -0.363 kg (-12.8 oz) Exam:   General:  Pt is alert, follows commands appropriately, not in acute distress  HEENT: No icterus, No thrush,  [place]/AT; red blood in his nares  Cardiovascular: RRR, S1/S2, no rubs, no gallops  Respiratory: Bibasilar crackles. No wheezes. Good air movement.  Abdomen: Soft/+BS, non tender, non distended, no guarding  Extremities: 2+ LE edema, No lymphangitis, No petechiae, No rashes, no synovitis  Data Reviewed: Basic Metabolic Panel:  Recent Labs Lab 04/18/13 0441 04/18/13 1129 04/19/13 0650 04/20/13 0610 04/21/13 0400  NA 137 138 136 138 136  K 4.7 5.2* 5.2* 4.8 3.9  CL 106 107 106 105 103  CO2 16* 17* 13* 21 20  GLUCOSE 92 110* 62* 94 93  BUN 61* 64* 73* 81* 82*  CREATININE 3.58* 3.63* 3.86* 3.98* 3.78*  CALCIUM 7.6* 7.9* 8.1* 8.3* 8.2*  PHOS  --   --   --   --  3.8   Liver Function Tests:  Recent Labs Lab 04/17/13 0346 04/18/13 0441 04/21/13 0400  AST 27 23  --   ALT 16 15  --   ALKPHOS 88 69  --   BILITOT 1.4* 1.3*  --   PROT 5.9* 5.4*  --   ALBUMIN 2.6* 2.2* 2.0*   No results  found for this basename: LIPASE, AMYLASE,  in the last 168 hours No results found for this basename: AMMONIA,  in the last 168 hours CBC:  Recent Labs Lab 04/17/13 0346 04/18/13 0441 04/19/13 0650 04/20/13 0610 04/20/13 1618 04/21/13 0400  WBC 7.2 9.4 7.3 7.0  --  6.0  NEUTROABS 6.5  --   --   --   --   --   HGB 9.4* 8.3* 8.1* 8.2*  --  7.9*  HCT 28.0* 24.9* 23.8* 23.3*  --  22.2*  MCV 97.2 96.9 96.0 94.0  --  92.1  PLT 63* 40* 40* 48* 46* 44*   Cardiac Enzymes:  Recent Labs Lab 04/17/13 0346 04/17/13 0955 04/17/13 1450 04/17/13 1920  TROPONINI <0.30 <0.30 <0.30 <0.30   BNP: No components found with this basename: POCBNP,  CBG: No results found for  this basename: GLUCAP,  in the last 168 hours  Recent Results (from the past 240 hour(s))  URINE CULTURE     Status: None   Collection Time    04/17/13  7:32 AM      Result Value Range Status   Specimen Description URINE, RANDOM   Final   Special Requests NONE   Final   Culture  Setup Time     Final   Value: 04/17/2013 08:48     Performed at [facility] Count     Final   Value: >=100,000 COLONIES/ML     Performed at [facility]   Culture     Final   Value: ESCHERICHIA COLI     Performed at [facility]   Report Status 04/19/2013 FINAL   Final   Organism ID, Bacteria ESCHERICHIA COLI   Final  MRSA PCR SCREENING     Status: None   Collection Time    04/17/13  9:47 AM      Result Value Range Status   MRSA by PCR NEGATIVE  NEGATIVE Final   Comment:            The GeneXpert MRSA Assay (FDA     approved for NASAL specimens     only), is one component of a     comprehensive MRSA colonization     surveillance program. It is not     intended to diagnose MRSA     infection nor to guide or     monitor treatment for     MRSA infections.  URINE CULTURE     Status: None   Collection Time    04/19/13  1:06 PM      Result Value Range Status   Specimen Description URINE, RANDOM   Final   Special Requests NONE   Final   Culture  Setup Time     Final   Value: 04/19/2013 22:50     Performed at [facility] Count     Final   Value: NO GROWTH     Performed at [facility]   Culture     Final   Value: NO GROWTH     Performed at [facility]   Report Status 04/20/2013 FINAL   Final  CULTURE, BLOOD (ROUTINE X 2)     Status: None   Collection Time    04/20/13  4:18 PM      Result Value Range Status   Specimen Description BLOOD RIGHT ARM   Final   Special Requests BOTTLES DRAWN AEROBIC ONLY 5CC   Final  Culture  Setup Time     Final   Value: 04/20/2013 21:17     Performed at [facility]   Culture     Final    Value:        BLOOD CULTURE RECEIVED NO GROWTH TO DATE CULTURE WILL BE HELD FOR 5 DAYS BEFORE ISSUING A FINAL NEGATIVE REPORT     Performed at [facility]   Report Status PENDING   Incomplete  CULTURE, BLOOD (ROUTINE X 2)     Status: None   Collection Time    04/20/13  4:28 PM      Result Value Range Status   Specimen Description BLOOD LEFT ARM   Final   Special Requests BOTTLES DRAWN AEROBIC ONLY 8CC   Final   Culture  Setup Time     Final   Value: 04/20/2013 21:17     Performed at [facility]   Culture     Final   Value:        BLOOD CULTURE RECEIVED NO GROWTH TO DATE CULTURE WILL BE HELD FOR 5 DAYS BEFORE ISSUING A FINAL NEGATIVE REPORT     Performed at [facility]   Report Status PENDING   Incomplete     Scheduled Meds: . cefTRIAXone (ROCEPHIN)  IV  2 g Intravenous Q24H  . famotidine  20 mg Oral QHS  . furosemide  80 mg Intravenous Daily  . pantoprazole  40 mg Oral BID  . sodium chloride  3 mL Intravenous Q12H   Continuous Infusions:    Ashten Sarnowski, DO  [facility] Pager [phone]  If 7PM-7AM, please contact night-coverage [email or web address] Password [email or web address] 04/21/2013, 2:13 PM   LOS: 4 days

## 2013-04-21 NOTE — Care Management Note (Signed)
    Page 1 of 1   04/21/2013     11:15:14 AM   CARE MANAGEMENT NOTE 04/21/2013  Patient:  BRYSIN, TOWERY   Account Number:  [id number]  Date Initiated:  04/18/2013  Documentation initiated by:  Junius Creamer  Subjective/Objective Assessment:   adm w sirs     Action/Plan:   lives alone, pcp dr don Christell Constant   Anticipated DC Date:     Anticipated DC Plan:        DC Planning Services  CM consult      Choice offered to / List presented to:             Status of service:   Medicare Important Message given?   (If response is "NO", the following Medicare IM given date fields will be blank) Date Medicare IM given:   Date Additional Medicare IM given:    Discharge Disposition:    Per UR Regulation:  Reviewed for med. necessity/level of care/duration of stay  If discussed at Long Length of Stay Meetings, dates discussed:    Comments:  04-21-13 1109  Tomi Bamberger, RN,BSN [phone] Pt with increasing SOB and  increasing creatinine. Per MD notes plan for IV lasix and no indication for HD at this time. Plan for MD to f/u closely. Pt will need PT/OT order for disposition needs. Pt has family support of daughters. Pt may need possible HH PT. CM will continue to monitor for disposition needs.

## 2013-04-21 NOTE — Progress Notes (Signed)
SUBJECTIVE:  He says that he is breathing better.  No distress.   PHYSICAL EXAM Filed Vitals:   04/20/13 1314 04/20/13 2001 04/21/13 0314 04/21/13 0427  BP: 95/54 95/55  127/54  Pulse: 100 105  100  Temp: 97.6 F (36.4 C) 97.5 F (36.4 C)  97.8 F (36.6 C)  TempSrc: Oral     Resp: 17 18  18   Height:      Weight:   244 lb 3.2 oz (110.768 kg)   SpO2: 97% 96%  99%   General:  No distress Lungs:  Clear Heart:  RRR Abdomen:  Mildly distended Extremities:  Moderate edema  LABS:  Results for orders placed during the hospital encounter of 04/17/13 (from the past 24 hour(s))  HEPATITIS B SURFACE ANTIGEN     Status: None   Collection Time    04/20/13  4:18 PM      Result Value Range   Hepatitis B Surface Ag NEGATIVE  NEGATIVE  HEPATITIS C ANTIBODY     Status: None   Collection Time    04/20/13  4:18 PM      Result Value Range   HCV Ab NEGATIVE  NEGATIVE  DIC (DISSEMINATED INTRAVASCULAR COAGULATION) PANEL     Status: Abnormal   Collection Time    04/20/13  4:18 PM      Result Value Range   Prothrombin Time 18.8 (*) 11.6 - 15.2 seconds   INR 1.62 (*) 0.00 - 1.49   aPTT 57 (*) 24 - 37 seconds   Fibrinogen 536 (*) 204 - 475 mg/dL   D-Dimer, Quant 8.29 (*) 0.00 - 0.48 ug/mL-FEU   Platelets 46 (*) 150 - 400 K/uL   Smear Review NO SCHISTOCYTES SEEN    C3 COMPLEMENT     Status: Abnormal   Collection Time    04/20/13  4:28 PM      Result Value Range   C3 Complement 59 (*) 90 - 180 mg/dL  C4 COMPLEMENT     Status: None   Collection Time    04/20/13  4:28 PM      Result Value Range   Complement C4, Body Fluid 22  10 - 40 mg/dL  URINALYSIS, ROUTINE W REFLEX MICROSCOPIC     Status: Abnormal   Collection Time    04/20/13  5:01 PM      Result Value Range   Color, Urine AMBER (*) YELLOW   APPearance HAZY (*) CLEAR   Specific Gravity, Urine 1.017  1.005 - 1.030   pH 5.5  5.0 - 8.0   Glucose, UA NEGATIVE  NEGATIVE mg/dL   Hgb urine dipstick MODERATE (*) NEGATIVE   Bilirubin Urine SMALL (*) NEGATIVE   Ketones, ur NEGATIVE  NEGATIVE mg/dL   Protein, ur 30 (*) NEGATIVE mg/dL   Urobilinogen, UA 1.0  0.0 - 1.0 mg/dL   Nitrite NEGATIVE  NEGATIVE   Leukocytes, UA MODERATE (*) NEGATIVE  SODIUM, URINE, RANDOM     Status: None   Collection Time    04/20/13  5:01 PM      Result Value Range   Sodium, Ur 72    CREATININE, URINE, RANDOM     Status: None   Collection Time    04/20/13  5:01 PM      Result Value Range   Creatinine, Urine 97.73    URINE MICROSCOPIC-ADD ON     Status: Abnormal   Collection Time    04/20/13  5:01 PM      Result  Value Range   Squamous Epithelial / LPF FEW (*) RARE   WBC, UA 21-50  <3 WBC/hpf   RBC / HPF 3-6  <3 RBC/hpf   Bacteria, UA FEW (*) RARE   Casts GRANULAR CAST (*) NEGATIVE  CBC     Status: Abnormal   Collection Time    04/21/13  4:00 AM      Result Value Range   WBC 6.0  4.0 - 10.5 K/uL   RBC 2.41 (*) 4.22 - 5.81 MIL/uL   Hemoglobin 7.9 (*) 13.0 - 17.0 g/dL   HCT 16.1 (*) 09.6 - 04.5 %   MCV 92.1  78.0 - 100.0 fL   MCH 32.8  26.0 - 34.0 pg   MCHC 35.6  30.0 - 36.0 g/dL   RDW 40.9 (*) 81.1 - 91.4 %   Platelets 44 (*) 150 - 400 K/uL  RENAL FUNCTION PANEL     Status: Abnormal   Collection Time    04/21/13  4:00 AM      Result Value Range   Sodium 136  135 - 145 mEq/L   Potassium 3.9  3.5 - 5.1 mEq/L   Chloride 103  96 - 112 mEq/L   CO2 20  19 - 32 mEq/L   Glucose, Bld 93  70 - 99 mg/dL   BUN 82 (*) 6 - 23 mg/dL   Creatinine, Ser 7.82 (*) 0.50 - 1.35 mg/dL   Calcium 8.2 (*) 8.4 - 10.5 mg/dL   Phosphorus 3.8  2.3 - 4.6 mg/dL   Albumin 2.0 (*) 3.5 - 5.2 g/dL   GFR calc non Af Amer 15 (*) >90 mL/min   GFR calc Af Amer 17 (*) >90 mL/min    Intake/Output Summary (Last 24 hours) at 04/21/13 9562 Last data filed at 04/20/13 2112  Gross per 24 hour  Intake    723 ml  Output    700 ml  Net     23 ml    ASSESSMENT AND PLAN:  ACUTE ON CHRONIC DIASTOLIC HF:  At this point his volume seems to be mostly  collected in his legs.   However, we want to be conservative and we're deferring medical management to nephrology. The only thing I did today was noted that he keeps his feet down throughout the end I took measures to try to correct this. We will otherwise follow as needed.  CKD:  Creatinine has been stable.  Rollene Rotunda 04/21/2013 7:11 AM

## 2013-04-21 NOTE — Progress Notes (Signed)
Per pts daughter, pt is a recovering alcoholic

## 2013-04-21 NOTE — Progress Notes (Signed)
I have seen and examined this patient and agree with plan as outlined by Dr. Shirlee Latch.  Agree with Lasix 80mg  IV daily.  Would also consider heme/onc eval and further imaging of liver.  Awaiting SPEP/UPEP. No indication for HD at this time and will cont to follow closely. Rayola Everhart A,MD 04/21/2013 10:05 AM

## 2013-04-22 ENCOUNTER — Inpatient Hospital Stay (HOSPITAL_COMMUNITY): Payer: Medicare Other

## 2013-04-22 LAB — RENAL FUNCTION PANEL
Albumin: 2 g/dL — ABNORMAL LOW (ref 3.5–5.2)
Calcium: 8.4 mg/dL (ref 8.4–10.5)
GFR calc Af Amer: 16 mL/min — ABNORMAL LOW (ref >90)
GFR calc non Af Amer: 14 mL/min — ABNORMAL LOW (ref >90)
Phosphorus: 5.2 mg/dL — ABNORMAL HIGH (ref 2.3–4.6)
Sodium: 139 mEq/L (ref 135–145)

## 2013-04-22 LAB — C4 COMPLEMENT: Complement C4, Body Fluid: 22 mg/dL (ref 10–40)

## 2013-04-22 LAB — UIFE/LIGHT CHAINS/TP QN, 24-HR UR
Beta, Urine: DETECTED — AB
Free Lambda Lt Chains,Ur: 5.62 mg/dL — ABNORMAL HIGH (ref 0.02–0.67)
Gamma Globulin, Urine: DETECTED — AB

## 2013-04-22 LAB — PROTEIN ELECTROPHORESIS, SERUM
Gamma Globulin: 21.9 % — ABNORMAL HIGH (ref 11.1–18.8)
M-Spike, %: NOT DETECTED g/dL

## 2013-04-22 LAB — CBC
MCH: 32 pg (ref 26.0–34.0)
MCHC: 34.5 g/dL (ref 30.0–36.0)
Platelets: 50 10*3/uL — ABNORMAL LOW (ref 150–400)
RBC: 2.44 MIL/uL — ABNORMAL LOW (ref 4.22–5.81)

## 2013-04-22 LAB — C3 COMPLEMENT: C3 Complement: 59 mg/dL — ABNORMAL LOW (ref 90–180)

## 2013-04-22 NOTE — Progress Notes (Signed)
Subjective: Patient states he is having cough and coughed blood yesterday but this is chronic. Breathing and swelling in his legs is better.  Denies other complaints except pain in lower legs/feet which is chronic s/p fractures.   RN: coughed blood clot yesterday  Objective: Vital signs in last 24 hours: Filed Vitals:   04/21/13 1142 04/21/13 1246 04/21/13 2000 04/22/13 0709  BP: 119/65 100/57 94/46 112/54  Pulse:  101 101 85  Temp:  97.3 F (36.3 C) 98.1 F (36.7 C) 97.8 F (36.6 C)  TempSrc:  Oral Axillary Oral  Resp:      Height:      Weight:    244 lb (110.678 kg)  SpO2:  97% 98% 99%   Weight change:   Intake/Output Summary (Last 24 hours) at 04/22/13 0858 Last data filed at 04/22/13 0704  Gross per 24 hour  Intake    720 ml  Output   2930 ml  Net  -2210 ml   Vitals reviewed. General: sitting up in recliner, NAD HEENT: [place]/at Cardiac: RRR, no rubs, murmurs or gallops Pulm: clear to auscultation bilaterally, no wheezes, rales, or rhonchi Abd: soft, nontender, mildly distended, BS present Ext: warm and well perfused, at least 2+ b/l lower ext. Edema though improved  Neuro: alert and oriented X3 (person, place, year), grossly neurologically intact   Lab Results: Basic Metabolic Panel:  Recent Labs Lab 04/21/13 0400 04/22/13 0620  NA 136 139  K 3.9 4.3  CL 103 104  CO2 20 24  GLUCOSE 93 86  BUN 82* 85*  CREATININE 3.78* 3.97*  CALCIUM 8.2* 8.4  PHOS 3.8 5.2*   Liver Function Tests:  Recent Labs Lab 04/17/13 0346 04/18/13 0441 04/21/13 0400 04/22/13 0620  AST 27 23  --   --   ALT 16 15  --   --   ALKPHOS 88 69  --   --   BILITOT 1.4* 1.3*  --   --   PROT 5.9* 5.4*  --   --   ALBUMIN 2.6* 2.2* 2.0* 2.0*  CBC:  Recent Labs Lab 04/17/13 0346  04/21/13 0400 04/22/13 0620  WBC 7.2  < > 6.0 7.2  NEUTROABS 6.5  --   --   --   HGB 9.4*  < > 7.9* 7.8*  HCT 28.0*  < > 22.2* 22.6*  MCV 97.2  < > 92.1 92.6  PLT 63*  < > 44* 50*  < > = values in  this interval not displayed. Cardiac Enzymes:  Recent Labs Lab 04/17/13 0955 04/17/13 1450 04/17/13 1920  TROPONINI <0.30 <0.30 <0.30   BNP:  Recent Labs Lab 04/17/13 0346  PROBNP 394.1*   D-Dimer:  Recent Labs Lab 04/17/13 0346 04/20/13 1618  DDIMER 2.45* 3.93*   Coagulation:  Recent Labs Lab 04/18/13 0441 04/20/13 1618 04/22/13 0620  LABPROT 19.7* 18.8* 17.3*  INR 1.72* 1.62* 1.45   Urinalysis:  Recent Labs Lab 04/19/13 1306 04/20/13 1701  COLORURINE ORANGE* AMBER*  LABSPEC 1.020 1.017  PHURINE 5.0 5.5  GLUCOSEU NEGATIVE NEGATIVE  HGBUR LARGE* MODERATE*  BILIRUBINUR SMALL* SMALL*  KETONESUR 15* NEGATIVE  PROTEINUR 100* 30*  UROBILINOGEN 1.0 1.0  NITRITE NEGATIVE NEGATIVE  LEUKOCYTESUR MODERATE* MODERATE*   8/20 urine Results for WOOD, NOVACEK SR. (MRN [id number]) as of 04/21/2013 06:51  Ref. Range 04/20/2013 17:01  Hgb urine dipstick Latest Range: NEGATIVE  MODERATE (A)  WBC, UA Latest Range: <3 WBC/hpf 21-50  RBC / HPF Latest Range: <3 RBC/hpf 3-6  Squamous Epithelial / LPF Latest Range: RARE  FEW (A)  Bacteria, UA Latest Range: RARE  FEW (A)  Casts Latest Range: NEGATIVE  GRANULAR CAST (A)   Results for ASHIR, KUNZ SR. (MRN [id number]) as of 04/21/2013 06:51  Ref. Range 04/20/2013 17:01  Sodium, Ur No range found 72  Creatinine, Urine No range found 97.73   Misc. Labs: Pending SPEP, total complement levels, ASO, ANA, ANCA. ADAMS TS 13    Micro Results: Recent Results (from the past 240 hour(s))  URINE CULTURE     Status: None   Collection Time    04/17/13  7:32 AM      Result Value Range Status   Specimen Description URINE, RANDOM   Final   Special Requests NONE   Final   Culture  Setup Time     Final   Value: 04/17/2013 08:48     Performed at [facility] Count     Final   Value: >=100,000 COLONIES/ML     Performed at [facility]   Culture     Final   Value: ESCHERICHIA COLI     Performed  at [facility]   Report Status 04/19/2013 FINAL   Final   Organism ID, Bacteria ESCHERICHIA COLI   Final  MRSA PCR SCREENING     Status: None   Collection Time    04/17/13  9:47 AM      Result Value Range Status   MRSA by PCR NEGATIVE  NEGATIVE Final   Comment:            The GeneXpert MRSA Assay (FDA     approved for NASAL specimens     only), is one component of a     comprehensive MRSA colonization     surveillance program. It is not     intended to diagnose MRSA     infection nor to guide or     monitor treatment for     MRSA infections.  URINE CULTURE     Status: None   Collection Time    04/19/13  1:06 PM      Result Value Range Status   Specimen Description URINE, RANDOM   Final   Special Requests NONE   Final   Culture  Setup Time     Final   Value: 04/19/2013 22:50     Performed at [facility] Count     Final   Value: NO GROWTH     Performed at [facility]   Culture     Final   Value: NO GROWTH     Performed at [facility]   Report Status 04/20/2013 FINAL   Final  CULTURE, BLOOD (ROUTINE X 2)     Status: None   Collection Time    04/20/13  4:18 PM      Result Value Range Status   Specimen Description BLOOD RIGHT ARM   Final   Special Requests BOTTLES DRAWN AEROBIC ONLY 5CC   Final   Culture  Setup Time     Final   Value: 04/20/2013 21:17     Performed at [facility]   Culture     Final   Value:        BLOOD CULTURE RECEIVED NO GROWTH TO DATE CULTURE WILL BE HELD FOR 5 DAYS BEFORE ISSUING A FINAL NEGATIVE REPORT     Performed at [facility]   Report  Status PENDING   Incomplete  CULTURE, BLOOD (ROUTINE X 2)     Status: None   Collection Time    04/20/13  4:28 PM      Result Value Range Status   Specimen Description BLOOD LEFT ARM   Final   Special Requests BOTTLES DRAWN AEROBIC ONLY 8CC   Final   Culture  Setup Time     Final   Value: 04/20/2013 21:17     Performed at [facility]    Culture     Final   Value:        BLOOD CULTURE RECEIVED NO GROWTH TO DATE CULTURE WILL BE HELD FOR 5 DAYS BEFORE ISSUING A FINAL NEGATIVE REPORT     Performed at [facility]   Report Status PENDING   Incomplete  URINE CULTURE     Status: None   Collection Time    04/20/13  5:01 PM      Result Value Range Status   Specimen Description URINE, CLEAN CATCH   Final   Special Requests NONE   Final   Culture  Setup Time     Final   Value: 04/21/2013 02:45     Performed at [facility] Count     Final   Value: NO GROWTH     Performed at [facility]   Culture     Final   Value: NO GROWTH     Performed at [facility]   Report Status 04/21/2013 FINAL   Final   Studies/Results: Dg Chest 2 View  04/22/2013   *RADIOLOGY REPORT*  Clinical Data: CHF and dyspnea.  CHEST - 2 VIEW  Comparison: 04/19/2013  Findings: Improved aeration in both lungs.  Findings suggest decreased interstitial edema.  There are still coarse interstitial lung markings suggesting chronic changes or residual mild edema. Heart size is within normal limits.  Median sternotomy wires are present.  No significant pleural effusions. There are known compression deformities in the lower thoracic spine and upper lumbar spine.  IMPRESSION: Improving aeration of the lungs suggests decreasing pulmonary edema.   Original Report Authenticated By: Richarda Overlie, M.D.   US Abdomen Complete  04/22/2013   *RADIOLOGY REPORT*  Abdominal ultrasound  History:  Splenomegaly; thrombocytopenia  Comparison:  April 19, 2013  Findings:  Gallbladder is visualized in multiple projections. There are no gallstones, gallbladder wall thickening, or pericholecystic fluid collection.  There is no intrahepatic, common hepatic, common bile duct dilatation.  Pancreas is partially obscured by gas; visualized portions of pancreas appear normal.  The liver has a nodular contour with inhomogeneous echotexture consistent with underlying  cirrhosis.  There is no well-defined liver mass on this study.  Spleen is enlarged.  Splenic volume measures 859 ml.  No focal splenic lesions are identified.  Kidneys bilaterally appear normal.  There is mild ascites surrounding the liver.  Aorta is nonaneurysmal.  Inferior vena cava appears patent.  Conclusion:  There is inhomogeneity to the echotexture of the liver, the liver has a nodular contour.  These changes consistent with cirrhosis.  While no focal liver masses are identified, it must be cautioned that the sensitivity of ultrasound for mass lesions is diminished in this circumstance.  Spleen is enlarged.  There is mild ascites.  Pancreas is incompletely visualized due to gas.  Visualized portions of pancreas appear normal.  Study otherwise unremarkable.   Original Report Authenticated By: Bretta Bang, M.D.   Medications: Scheduled Meds: . cefTRIAXone (ROCEPHIN)  IV  2 g Intravenous Q24H  . famotidine  20 mg Oral QHS  . furosemide  80 mg Intravenous Daily  . pantoprazole  40 mg Oral BID  . sodium chloride  3 mL Intravenous Q12H   Continuous Infusions:  PRN Meds:.albuterol, ALPRAZolam, guaiFENesin-dextromethorphan, HYDROcodone-acetaminophen, ondansetron (ZOFRAN) IV, ondansetron Assessment/Plan: Mr. Southgate is a 67 y.o. yo male with a PMHX of CAD s/p CABG (1998), ? MI vs angina (per pt), AKI, hiatal hernia, upper GIB 2/2 peptic ulcer, chronic dyspnea, HTN, arthritis who was admitted to [facility] on 04/17/2013 for evaluation of chest pain and worsening shortness of breath found to be in SVT in the ED 8/17. He also had nausea, vomiting, diarrhea, mild dry cough, prior to admission. On 04/17/2013 with patient was found to have acute renal failure, severe metabolic acidosis with gap, hypotension, E.coli UTI, supratherapeutic INR, anemia/thrombocytopenia, splenomegaly. He is now fluid overloaded with persistent hypotension and concern for worsening renal failure, possible DIC.   #Acute renal failure  versus subacute  -Likely due to ischemic ATN, hypotension due to sepsis, volume depletion due to nausea/vomiting/diarrhea prior to admission  -Avoid nephrotoxic drugs  -Renal US with splenomegaly and normal kidneys  -pending SPEP, total complement levels, ASO;  ANA (neg), ANCA (neg), UPEP (kappa and lambda chains)  #Severe metabolic acidosis with AG noted  -AG on admission was 15-->15-->14-->17-->12-->13 (8/21)-->pending renal panel today. He had lactic acidosis on 04/17/13 of 4.06 which could be etiology of acidosis as well as diarrhea which would cause non AG acidosis. Infection and renal failure can also cause metabolic acidosis.  #Concern for DIC  -Etiology could be related  E.Coli UTI. Though pt has not had schisctocytes, has high fibrinogen.  -pending ADAMTS 13 to w/u for TTP  -Consider H/O consult  #Elevated INR  -? If patient is vitamin K deficient vs. Cirrhosis  -US abdomen today   #Hypotension with history of HTN  -BP still soft 112/54 this am and sbp in the 80s-90s -etiology of hypotension could be related to sepsis, volume depletion though currently he appears fluid overloaded  -Monitor VS  #Fluid overload likely secondary to acute on chronic diastolic CHF -fluid overload could be secondary to acute renal failure and possible liver disease  -strict i/o, daily wts  -8/20 echo w/ EF of 55-60% Hepatitis negative  -Lasix 80 mg qd  -US abdomen to evaluate liver today  #Sepsis secondary to E.coli UTI  -On Rocephin  -Negative repeat urine culture, NTD blood cultures  #Diarrhea/nausea/vomiting, resolved -Suspected Gastroenteritis. Diarrhea resolved as of now  -Pending GI pathogens to be collected though pt no longer has diarrhea. Given Flagyl 500 mg q8 8/17-8/18   #Normocytic Anemia  -9.4 on admission and hbg has decreased with FOBT + stools on admission -Trend CBC (pending today) -GI input continue protonix bid no further w/u now  #Thrombocytopenia  -Platelets 63 on  admission trended down -Patient denies alcohol or other drugs. Etiology could be due to splenomegaly which needs further w/u. Etiology also could be due to acute critical illness. Negative hepatitis panel  -If etiology not found consider H/O consult  -Trend CBC.  #Splenomegaly  -patient denies abdominal pain  -Etiology could be due to infection though negative Hepatitis labs, ?diastolic CHF, cirrhosis, consider malignancy (CML, CLL, other) -Consider H/O consult   #Anasarca likely due to cirrhosis -negative hepatitis C ab, Hep B sAg  -pending abdominal US to w/u possible liver etiology (i.e cirrhosis, fatty liver)  # FOBT + on 04/17/13  -History of upper  GIB due to peptic ulcer disease most recently 10/2012 when patient was taking NSAIDS, Aspirin per outside records   -Currently on PPI Protonix 40 mg bid -GI no further w/u at this moment -will need to f/u with GI outpatient in [place]   #CAD s/p CABG  -consider checking lipid panel, Consider BB in the future, statin, and possibly ACEI/ARB once acute issues resolved   #Suspected acute on chronic diastolic CHF -CXR 8/19 with diffuse interstitial infiltrates and concern for CHF.  -nl Echo 8/20. No comment on diastolic dysfunction though question if there is diastolic dysfunction  -Cardiology consulted. He has been lost to f/u outpatient for years. Rec ischemic evaluation outpatient  -Lasix 80 mg qd   #Worsening dyspnea  -stable.  Etiology could be due to acute on chronic diastolic CHF -[place] negative for DVT b/l   #F/E/N  -Trend RFP  -Renal diet   #DVT PPX  - scds due to thrombocytopenia     LOS: 5 days   Annett Gula [phone] 04/22/2013, 8:58 AM  Discussed with attending

## 2013-04-22 NOTE — Progress Notes (Signed)
[facility] PROGRESS NOTE Subjective Pt is c/o of nosebleed. Denies obvious GI bleed.  Objective: Vital signs in last 24 hours: Temp:  [97.3 F (36.3 C)-98.1 F (36.7 C)] 97.8 F (36.6 C) (08/22 0709) Pulse Rate:  [85-101] 85 (08/22 0709) BP: (94-119)/(46-65) 112/54 mmHg (08/22 0709) SpO2:  [97 %-99 %] 99 % (08/22 0709) Weight:  [110.678 kg (244 lb)] 110.678 kg (244 lb) (08/22 0709) Last BM Date: 04/17/13  Intake/Output from previous day: 08/21 0701 - 08/22 0700 In: 720 [P.O.:720] Out: 2430 [Urine:2430] Intake/Output this shift: Total I/O In: 0  Out: 500 [Urine:500]  PE: General--no distress Heart-- Lungs-- Abdomen--soft nontender  Lab Results:  Recent Labs  04/20/13 0610 04/20/13 1618 04/21/13 0400 04/22/13 0620  WBC 7.0  --  6.0 7.2  HGB 8.2*  --  7.9* 7.8*  HCT 23.3*  --  22.2* 22.6*  PLT 48* 46* 44* 50*   BMET  Recent Labs  04/20/13 0610 04/21/13 0400 04/22/13 0620  NA 138 136 139  K 4.8 3.9 4.3  CL 105 103 104  CO2 21 20 24   CREATININE 3.98* 3.78* 3.97*   LFT No results found for this basename: PROT, AST, ALT, ALKPHOS, BILITOT, BILIDIR, IBILI,  in the last 72 hours PT/INR  Recent Labs  04/20/13 1618 04/22/13 0620  LABPROT 18.8* 17.3*  INR 1.62* 1.45   PANCREAS No results found for this basename: LIPASE,  in the last 72 hours       Studies/Results: Dg Chest 2 View  04/22/2013   *RADIOLOGY REPORT*  Clinical Data: CHF and dyspnea.  CHEST - 2 VIEW  Comparison: 04/19/2013  Findings: Improved aeration in both lungs.  Findings suggest decreased interstitial edema.  There are still coarse interstitial lung markings suggesting chronic changes or residual mild edema. Heart size is within normal limits.  Median sternotomy wires are present.  No significant pleural effusions. There are known compression deformities in the lower thoracic spine and upper lumbar spine.  IMPRESSION: Improving aeration of the lungs suggests decreasing  pulmonary edema.   Original Report Authenticated By: Richarda Overlie, M.D.   US Abdomen Complete  04/22/2013   *RADIOLOGY REPORT*  Abdominal ultrasound  History:  Splenomegaly; thrombocytopenia  Comparison:  April 19, 2013  Findings:  Gallbladder is visualized in multiple projections. There are no gallstones, gallbladder wall thickening, or pericholecystic fluid collection.  There is no intrahepatic, common hepatic, common bile duct dilatation.  Pancreas is partially obscured by gas; visualized portions of pancreas appear normal.  The liver has a nodular contour with inhomogeneous echotexture consistent with underlying cirrhosis.  There is no well-defined liver mass on this study.  Spleen is enlarged.  Splenic volume measures 859 ml.  No focal splenic lesions are identified.  Kidneys bilaterally appear normal.  There is mild ascites surrounding the liver.  Aorta is nonaneurysmal.  Inferior vena cava appears patent.  Conclusion:  There is inhomogeneity to the echotexture of the liver, the liver has a nodular contour.  These changes consistent with cirrhosis.  While no focal liver masses are identified, it must be cautioned that the sensitivity of ultrasound for mass lesions is diminished in this circumstance.  Spleen is enlarged.  There is mild ascites.  Pancreas is incompletely visualized due to gas.  Visualized portions of pancreas appear normal.  Study otherwise unremarkable.   Original Report Authenticated By: Bretta Bang, M.D.    Medications: I have reviewed the patient's current medications.  Assessment/Plan: 1. + Stools/anemia. Not clear he is having any  GI bleeding. Since he had EGD and Colonoscopy within last few months, would not repeat unless actively bleeding. Since he had an ulcer would just continue him on Protonix. Please call us if we can be of further help.   Yariela Tison JR,Candice Lunney L 04/22/2013, 10:14 AM

## 2013-04-22 NOTE — Progress Notes (Signed)
[facility] PROGRESS NOTE  Jay Mclean WJX:[id number] DOB: 08-10-1946 DOA: 04/17/2013 PCP: [name], MD  Brief narrative: [name] has had a long hospital stay with multiple acute problems.  Today he notes that he feels "much better" than yesterday and that the swelling in his legs is improved.  He has been having nosebleeds and coughed up some blood, but these are chronic.   Principal Problem:   Gastroenteritis Active Problems:   Acute renal failure   Thrombocytopenia   Hypertension   Coronary atherosclerosis of native coronary artery   SIRS (systemic inflammatory response syndrome)   Dehydration   Increased anion gap metabolic acidosis   Positive D dimer   Acute CHF   Acute diastolic CHF (congestive heart failure)  Assessment/Plan:   Acute renal failure most likely due to ATN from septic and hypovolemic shock,  - I/O appear to be better recorded, he is neg about 1700cc yesterday with 2400cc UOP noted.   - Renal function with very slight bump today, likely normal variation - Continue Strict I/O and daily weights  - RUS--no hydronephrosis  - UA--rather bland urinalysis at the time of admission  - Nephrology consultation--> started IV furosemide 80 mg daily (04/21/13), they are continuing to follow and manage fluids.   - SPEP, Total C' levels and ASO are pending still  Severe sepsis due to UTI/pyelonephritis due to E.coli - UC from 8/17 - + for E. Coli - UC from 8/19 - NG - BC X 2 from 8/20: NG - UC from 8/20: NG - Rocephin day 5  Dehydration due to diarrhea,  - Originally dehydrated, became volume overloaded on bicard drip - patient continues to appear clinically hypervolemic, though he states this is improved.   Acute diastolic CHF  - Started IV furosemide per renal  - Echocardiogram--EF 55-60%, no wall motion abnormalities - May need Foley catheter  - Repeat CXR 8/22 - improved aeration and decreased edema - cardiology has signed off, they  will see outpatient  Thrombocytopenia/coagulopathy  - likely multifactorial including sepsis, low grade DIC, and underlying liver disease and splenomegaly  - continue to treat underlying infection  - Platelets have remained stable and are mildly improved today, periph smear negative for schistocytes, INR improved today - Obtained full abdominal ultrasound to image liver: Liver consistent with cirrhosis, but no mass, spleen is enlarged, mild ascites around liver.   - Hepatitis B surface antigen, hepatitis C antibody--neg  - Continue to Trend platelets  - ADAMTS13 pending - Will monitor patient/labs for another day, if no improvement will ask for hematology consult.   Hypertension  - BP's continue to be mildly low, hold home medications.   Increased anion gap metabolic acidosis. Was previously like to due to lactic acidosis, ketosis, and renal failure. Likely due to renal failure predominantly at this time.  - AG is 67 today, which is improved  Positive D dimer.  - likely due to sepsis, tachycardia improved - PE less likely as bilateral LE dopplers are negative (8/19) and low probability of PE Anner Crete' score= 1.5)   Hx of bleeding ulcers/Anemia/positive FOBT  - PUD with GIB in March 2014  - Restart PPI  - Hgb drop from 9.4-->7.9, stabilized today (8/22)  - consulted GI, no need for acute EGD at this time, unless active bleeding.   Nosebleed, coughing blood - patient noted to have epistaxis which is likely to cause of his blood-tinged sputum  -Patient further states he has had intermitten epistaxis in past  6 months  -repeat CXR in am - as noted above  Diet: Renal diet  Access: PIV  Proph: SCDs  Code Status: full  Family Communication: spoke with patient alone  Disposition Plan: pending improvement in kidney function, further evaluation for thrombocytopenia/anemia   Consultants:  Cardiology  GI  Nephrology  Procedures/Studies:  No results found.  Abdominal  Ultrasound  Antibiotics:  Rocephin day 5/7  Code Status: Full Family Communication: Pt at bedside Disposition Plan: Home when medically stable    Objective: Filed Vitals:   04/21/13 1142 04/21/13 1246 04/21/13 2000 04/22/13 0709  BP: 119/65 100/57 94/46 112/54  Pulse:  101 101 85  Temp:  97.3 F (36.3 C) 98.1 F (36.7 C) 97.8 F (36.6 C)  TempSrc:  Oral Axillary Oral  Resp:      Height:      Weight:    244 lb (110.678 kg)  SpO2:  97% 98% 99%    Intake/Output Summary (Last 24 hours) at 04/22/13 8295 Last data filed at 04/22/13 6213  Gross per 24 hour  Intake    720 ml  Output   2930 ml  Net  -2210 ml    Exam:   General:  Pt is alert, follows commands appropriately, not in acute distress, oriented to person place time.   Cardiovascular: Regular rate and rhythm, S1/S2, no murmurs, no rubs, no gallops  Respiratory: Clear to auscultation bilaterally, no wheezing, no crackles  Abdomen: Soft, non tender, non distended, bowel sounds present  Extremities: 2+ edema, cool extremities  Neuro: Grossly nonfocal  Data Reviewed: Basic Metabolic Panel:  Recent Labs Lab 04/18/13 0441 04/18/13 1129 04/19/13 0650 04/20/13 0610 04/21/13 0400  NA 137 138 136 138 136  K 4.7 5.2* 5.2* 4.8 3.9  CL 106 107 106 105 103  CO2 16* 17* 13* 21 20  GLUCOSE 92 110* 62* 94 93  BUN 61* 64* 73* 81* 82*  CREATININE 3.58* 3.63* 3.86* 3.98* 3.78*  CALCIUM 7.6* 7.9* 8.1* 8.3* 8.2*  PHOS  --   --   --   --  3.8   Liver Function Tests:  Recent Labs Lab 04/17/13 0346 04/18/13 0441 04/21/13 0400  AST 27 23  --   ALT 16 15  --   ALKPHOS 88 69  --   BILITOT 1.4* 1.3*  --   PROT 5.9* 5.4*  --   ALBUMIN 2.6* 2.2* 2.0*   CBC:  Recent Labs Lab 04/17/13 0346 04/18/13 0441 04/19/13 0650 04/20/13 0610 04/20/13 1618 04/21/13 0400  WBC 7.2 9.4 7.3 7.0  --  6.0  NEUTROABS 6.5  --   --   --   --   --   HGB 9.4* 8.3* 8.1* 8.2*  --  7.9*  HCT 28.0* 24.9* 23.8* 23.3*  --  22.2*   MCV 97.2 96.9 96.0 94.0  --  92.1  PLT 63* 40* 40* 48* 46* 44*   Cardiac Enzymes:  Recent Labs Lab 04/17/13 0346 04/17/13 0955 04/17/13 1450 04/17/13 1920  TROPONINI <0.30 <0.30 <0.30 <0.30     Recent Results (from the past 240 hour(s))  URINE CULTURE     Status: None   Collection Time    04/17/13  7:32 AM      Result Value Range Status   Specimen Description URINE, RANDOM   Final   Special Requests NONE   Final   Culture  Setup Time     Final   Value: 04/17/2013 08:48     Performed at  [facility] [employer] [facility] Count     Final   Value: >=100,000 COLONIES/ML     Performed at [facility]     Final   Value: ESCHERICHIA COLI     Performed at [facility]   Report Status 04/19/2013 FINAL   Final   Organism ID, Bacteria ESCHERICHIA COLI   Final  MRSA PCR SCREENING     Status: None   Collection Time    04/17/13  9:47 AM      Result Value Range Status   MRSA by PCR NEGATIVE  NEGATIVE Final   Comment:            The GeneXpert MRSA Assay (FDA     approved for NASAL specimens     only), is one component of a     comprehensive MRSA colonization     surveillance program. It is not     intended to diagnose MRSA     infection nor to guide or     monitor treatment for     MRSA infections.  URINE CULTURE     Status: None   Collection Time    04/19/13  1:06 PM      Result Value Range Status   Specimen Description URINE, RANDOM   Final   Special Requests NONE   Final   Culture  Setup Time     Final   Value: 04/19/2013 22:50     Performed at [facility] Count     Final   Value: NO GROWTH     Performed at [facility]   Culture     Final   Value: NO GROWTH     Performed at [facility]   Report Status 04/20/2013 FINAL   Final  CULTURE, BLOOD (ROUTINE X 2)     Status: None   Collection Time    04/20/13  4:18 PM      Result Value Range Status   Specimen Description BLOOD RIGHT ARM   Final   Special  Requests BOTTLES DRAWN AEROBIC ONLY 5CC   Final   Culture  Setup Time     Final   Value: 04/20/2013 21:17     Performed at [facility]   Culture     Final   Value:        BLOOD CULTURE RECEIVED NO GROWTH TO DATE CULTURE WILL BE HELD FOR 5 DAYS BEFORE ISSUING A FINAL NEGATIVE REPORT     Performed at [facility]   Report Status PENDING   Incomplete  CULTURE, BLOOD (ROUTINE X 2)     Status: None   Collection Time    04/20/13  4:28 PM      Result Value Range Status   Specimen Description BLOOD LEFT ARM   Final   Special Requests BOTTLES DRAWN AEROBIC ONLY 8CC   Final   Culture  Setup Time     Final   Value: 04/20/2013 21:17     Performed at [facility]   Culture     Final   Value:        BLOOD CULTURE RECEIVED NO GROWTH TO DATE CULTURE WILL BE HELD FOR 5 DAYS BEFORE ISSUING A FINAL NEGATIVE REPORT     Performed at [facility]   Report Status PENDING   Incomplete  URINE CULTURE     Status: None   Collection Time    04/20/13  5:01 PM      Result Value Range Status   Specimen Description URINE, CLEAN CATCH   Final   Special Requests NONE   Final   Culture  Setup Time     Final   Value: 04/21/2013 02:45     Performed at [facility] Count     Final   Value: NO GROWTH     Performed at [facility]   Culture     Final   Value: NO GROWTH     Performed at [facility]   Report Status 04/21/2013 FINAL   Final     Scheduled Meds: . cefTRIAXone (ROCEPHIN)  IV  2 g Intravenous Q24H  . famotidine  20 mg Oral QHS  . furosemide  80 mg Intravenous Daily  . pantoprazole  40 mg Oral BID  . sodium chloride  3 mL Intravenous Q12H   Continuous Infusions:    Debe Coder, MD  [facility] Pager [phone]  If 7PM-7AM, please contact night-coverage [email or web address] Password [facility] 04/22/2013, 7:24 AM   LOS: 5 days

## 2013-04-22 NOTE — Progress Notes (Signed)
I have seen and examined this patient and agree with plan as outlined by Dr. Shirlee Latch.  Agree with holding diuretics given azotemia and hypotension.  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  Underlying cirrhosis and may benefit from hepatology evaluation as an outpt. [name] A,MD 04/22/2013 1:36 PM

## 2013-04-22 NOTE — Progress Notes (Signed)
SUBJECTIVE:  "I feel great"  No distress.   PHYSICAL EXAM Filed Vitals:   04/21/13 1142 04/21/13 1246 04/21/13 2000 04/22/13 0709  BP: 119/65 100/57 94/46 112/54  Pulse:  101 101 85  Temp:  97.3 F (36.3 C) 98.1 F (36.7 C) 97.8 F (36.6 C)  TempSrc:  Oral Axillary Oral  Resp:      Height:      Weight:    244 lb (110.678 kg)  SpO2:  97% 98% 99%   General:  No distress Lungs:  Clear Heart:  RRR Abdomen:  Mildly distended Extremities:  Moderate edema  LABS:  Results for orders placed during the hospital encounter of 04/17/13 (from the past 24 hour(s))  GAMMA GT     Status: None   Collection Time    04/21/13 11:38 AM      Result Value Range   GGT 50  7 - 51 U/L  CBC     Status: Abnormal   Collection Time    04/22/13  6:20 AM      Result Value Range   WBC 7.2  4.0 - 10.5 K/uL   RBC 2.44 (*) 4.22 - 5.81 MIL/uL   Hemoglobin 7.8 (*) 13.0 - 17.0 g/dL   HCT 40.9 (*) 81.1 - 91.4 %   MCV 92.6  78.0 - 100.0 fL   MCH 32.0  26.0 - 34.0 pg   MCHC 34.5  30.0 - 36.0 g/dL   RDW 78.2 (*) 95.6 - 21.3 %   Platelets 50 (*) 150 - 400 K/uL  RENAL FUNCTION PANEL     Status: Abnormal   Collection Time    04/22/13  6:20 AM      Result Value Range   Sodium 139  135 - 145 mEq/L   Potassium 4.3  3.5 - 5.1 mEq/L   Chloride 104  96 - 112 mEq/L   CO2 24  19 - 32 mEq/L   Glucose, Bld 86  70 - 99 mg/dL   BUN 85 (*) 6 - 23 mg/dL   Creatinine, Ser 0.86 (*) 0.50 - 1.35 mg/dL   Calcium 8.4  8.4 - 57.8 mg/dL   Phosphorus 5.2 (*) 2.3 - 4.6 mg/dL   Albumin 2.0 (*) 3.5 - 5.2 g/dL   GFR calc non Af Amer 14 (*) >90 mL/min   GFR calc Af Amer 16 (*) >90 mL/min  PROTIME-INR     Status: Abnormal   Collection Time    04/22/13  6:20 AM      Result Value Range   Prothrombin Time 17.3 (*) 11.6 - 15.2 seconds   INR 1.45  0.00 - 1.49  APTT     Status: Abnormal   Collection Time    04/22/13  6:20 AM      Result Value Range   aPTT 55 (*) 24 - 37 seconds    Intake/Output Summary (Last 24 hours)  at 04/22/13 0840 Last data filed at 04/22/13 4696  Gross per 24 hour  Intake    720 ml  Output   2930 ml  Net  -2210 ml    ASSESSMENT AND PLAN:  ACUTE ON CHRONIC DIASTOLIC HF:  I have no further change to his meds.  Volume management per renal.  No further in patient cardiac evaluation is planned.  He should call for follow up in our office 4 - 6 weeks after discharge [phone].    Of note the patient does not meet ACC/AHA criteria for continued telemetry.  I reviewed the telemetry and will discontinue.  Please call with further questions.   CKD:  Creatinine has been stable.  Fayrene Fearing [facility] 04/22/2013 8:40 AM

## 2013-04-23 LAB — COMPREHENSIVE METABOLIC PANEL
ALT: 18 U/L (ref 0–53)
CO2: 22 mEq/L (ref 19–32)
Calcium: 8.1 mg/dL — ABNORMAL LOW (ref 8.4–10.5)
Creatinine, Ser: 3.37 mg/dL — ABNORMAL HIGH (ref 0.50–1.35)
GFR calc Af Amer: 20 mL/min — ABNORMAL LOW (ref >90)
GFR calc non Af Amer: 17 mL/min — ABNORMAL LOW (ref >90)
Glucose, Bld: 96 mg/dL (ref 70–99)
Total Bilirubin: 1.2 mg/dL (ref 0.3–1.2)

## 2013-04-23 LAB — CBC
HCT: 21.6 % — ABNORMAL LOW (ref 39.0–52.0)
MCHC: 36.1 g/dL — ABNORMAL HIGH (ref 30.0–36.0)
MCV: 91.5 fL (ref 78.0–100.0)
RDW: 17.3 % — ABNORMAL HIGH (ref 11.5–15.5)

## 2013-04-23 NOTE — Progress Notes (Signed)
[facility] PROGRESS NOTE  Jay Mclean ZOX:[id number] DOB: 1946/01/28 DOA: 04/17/2013 PCP: [name], MD  Subjective:  Patient reports feeling well, denied SOB or any other concerns.  Continues to have swelling in the LE, but good UOP.  He is requesting to go home.  I advised him to stay and work with PT/OT.   Principal Problem:   Gastroenteritis Active Problems:   Acute renal failure   Thrombocytopenia   Hypertension   Coronary atherosclerosis of native coronary artery   SIRS (systemic inflammatory response syndrome)   Dehydration   Increased anion gap metabolic acidosis   Positive D dimer   Acute CHF   Acute diastolic CHF (congestive heart failure)  Assessment/Plan:   Acute renal failure most likely due to ATN from septic and hypovolemic shock,  - UOP recorded well - Renal function improved today - Continue Strict I/O and daily weights  - RUS--no hydronephrosis  - Nephrology consultation-->  Improvement of renal function with stopping lasix.  Continue to monitor.  - No M spike, ASO and complement normal  Severe sepsis due to UTI/pyelonephritis due to E.coli  - UC from 8/17 - + for E. Coli  - UC from 8/19 - NG  - BC X 2 from 8/20: NG  - UC from 8/20: NG  - Rocephin day 6  Dehydration due to diarrhea,  - Originally dehydrated, became volume overloaded on bicarb drip  - patient continues to appear clinically hypervolemic, though his legs continue to improve.  Will ask for Unna boots to the legs  Acute diastolic CHF  - Lasix stopped, renal function improving.  - Echocardiogram--EF 55-60%, no wall motion abnormalities  - Repeat CXR 8/22 - improved aeration and decreased edema  - cardiology has signed off, they will see outpatient   Thrombocytopenia/coagulopathy  - likely multifactorial including sepsis, low grade DIC, and underlying liver disease and splenomegaly  - continue to treat underlying infection  - Platelets  are mildly improved today,   INR stable today  - Obtained full abdominal ultrasound to image liver: Liver consistent with cirrhosis, but no mass, spleen is enlarged, mild ascites around liver.  - Hepatitis B surface antigen, hepatitis C antibody--neg  - Continue to Trend platelets daily  - ADAMTS13 pending  - Labs have improved today and I anticipate they will continue to improve.  Will get CBC with differential for tomorrow to see if there are any abnormalities.  I think because his labs continue to improve and he has no active bleeding/petechiae, can likely attribute his issues to acute illness and have further evaluation as an outpatient, unless there is a change clinically.   Hypertension  - BP's within normal range off antihypertensives, hold home medications.   Increased anion gap metabolic acidosis. Was previously like to due to lactic acidosis, ketosis, and renal failure. Likely due to renal failure predominantly at this time.  - AG is 12 today  Hx of bleeding ulcers/Anemia/positive FOBT  - PUD with GIB in March 2014  - Restart PPI  - Hgb drop from 9.4-->7.9, stabilized today (8/22)  - consulted GI, no need for acute EGD at this time, unless active bleeding.   Diet: Renal diet  Access: PIV  Proph: SCDs  Code Status: full  Family Communication: spoke with patient alone  Disposition Plan: pending improvement in kidney function, PT/OT eval   Consultants:  Nephrology  PT/OT  Procedures/Studies: Dg Chest 2 View  04/22/2013   *RADIOLOGY REPORT*  Clinical Data: CHF and dyspnea.  CHEST - 2 VIEW  Comparison: 04/19/2013  Findings: Improved aeration in both lungs.  Findings suggest decreased interstitial edema.  There are still coarse interstitial lung markings suggesting chronic changes or residual mild edema. Heart size is within normal limits.  Median sternotomy wires are present.  No significant pleural effusions. There are known compression deformities in the lower thoracic spine and upper lumbar spine.   IMPRESSION: Improving aeration of the lungs suggests decreasing pulmonary edema.   Original Report Authenticated By: Richarda Overlie, M.D.   US Abdomen Complete  04/22/2013   *RADIOLOGY REPORT*  Abdominal ultrasound  History:  Splenomegaly; thrombocytopenia  Comparison:  April 19, 2013  Findings:  Gallbladder is visualized in multiple projections. There are no gallstones, gallbladder wall thickening, or pericholecystic fluid collection.  There is no intrahepatic, common hepatic, common bile duct dilatation.  Pancreas is partially obscured by gas; visualized portions of pancreas appear normal.  The liver has a nodular contour with inhomogeneous echotexture consistent with underlying cirrhosis.  There is no well-defined liver mass on this study.  Spleen is enlarged.  Splenic volume measures 859 ml.  No focal splenic lesions are identified.  Kidneys bilaterally appear normal.  There is mild ascites surrounding the liver.  Aorta is nonaneurysmal.  Inferior vena cava appears patent.  Conclusion:  There is inhomogeneity to the echotexture of the liver, the liver has a nodular contour.  These changes consistent with cirrhosis.  While no focal liver masses are identified, it must be cautioned that the sensitivity of ultrasound for mass lesions is diminished in this circumstance.  Spleen is enlarged.  There is mild ascites.  Pancreas is incompletely visualized due to gas.  Visualized portions of pancreas appear normal.  Study otherwise unremarkable.   Original Report Authenticated By: Bretta Bang, M.D.       Antibiotics:  Rocephin day 6   Objective: Filed Vitals:   04/22/13 1345 04/23/13 0100 04/23/13 0552 04/23/13 1300  BP: 115/56  125/67 121/63  Pulse: 82  84 96  Temp: 98 F (36.7 C)  98.1 F (36.7 C) 99 F (37.2 C)  TempSrc: Oral   Oral  Resp: 17  20 18   Height:      Weight:  244 lb (110.678 kg)    SpO2: 96%  98% 98%    Intake/Output Summary (Last 24 hours) at 04/23/13 1515 Last data filed  at 04/23/13 0454  Gross per 24 hour  Intake    120 ml  Output   1050 ml  Net   -930 ml    Exam:   General:  Pt is alert, follows commands appropriately, not in acute distress  Skin: No petechiae, + ecchymoses on arms due to needle punctures, no new bruises  Cardiovascular: Regular rate and rhythm, S1/S2, soft murmur noted today  Respiratory: Clear to auscultation bilaterally, no wheezing  Abdomen: Soft, non tender, non distended, bowel sounds present  Extremities: 2+ bilateral edema to LE, warm  Neuro: Grossly nonfocal  Data Reviewed: Basic Metabolic Panel:  Recent Labs Lab 04/19/13 0650 04/20/13 0610 04/21/13 0400 04/22/13 0620 04/23/13 0455  NA 136 138 136 139 134*  K 5.2* 4.8 3.9 4.3 3.9  CL 106 105 103 104 100  CO2 13* 21 20 24 22   GLUCOSE 62* 94 93 86 96  BUN 73* 81* 82* 85* 80*  CREATININE 3.86* 3.98* 3.78* 3.97* 3.37*  CALCIUM 8.1* 8.3* 8.2* 8.4 8.1*  PHOS  --   --  3.8 5.2*  --    Liver  Function Tests:  Recent Labs Lab 04/17/13 0346 04/18/13 0441 04/21/13 0400 04/22/13 0620 04/23/13 0455  AST 27 23  --   --  30  ALT 16 15  --   --  18  ALKPHOS 88 69  --   --  153*  BILITOT 1.4* 1.3*  --   --  1.2  PROT 5.9* 5.4*  --   --  5.2*  ALBUMIN 2.6* 2.2* 2.0* 2.0* 1.8*  CBC:  Recent Labs Lab 04/17/13 0346  04/19/13 0650 04/20/13 0610 04/20/13 1618 04/21/13 0400 04/22/13 0620 04/23/13 0455  WBC 7.2  < > 7.3 7.0  --  6.0 7.2 8.7  NEUTROABS 6.5  --   --   --   --   --   --   --   HGB 9.4*  < > 8.1* 8.2*  --  7.9* 7.8* 7.8*  HCT 28.0*  < > 23.8* 23.3*  --  22.2* 22.6* 21.6*  MCV 97.2  < > 96.0 94.0  --  92.1 92.6 91.5  PLT 63*  < > 40* 48* 46* 44* 50* 60*  < > = values in this interval not displayed. Cardiac Enzymes:  Recent Labs Lab 04/17/13 0346 04/17/13 0955 04/17/13 1450 04/17/13 1920  TROPONINI <0.30 <0.30 <0.30 <0.30     Recent Results (from the past 240 hour(s))  URINE CULTURE     Status: None   Collection Time    04/17/13   7:32 AM      Result Value Range Status   Specimen Description URINE, RANDOM   Final   Special Requests NONE   Final   Culture  Setup Time     Final   Value: 04/17/2013 08:48     Performed at [facility] Count     Final   Value: >=100,000 COLONIES/ML     Performed at [facility]   Culture     Final   Value: ESCHERICHIA COLI     Performed at [facility]   Report Status 04/19/2013 FINAL   Final   Organism ID, Bacteria ESCHERICHIA COLI   Final  MRSA PCR SCREENING     Status: None   Collection Time    04/17/13  9:47 AM      Result Value Range Status   MRSA by PCR NEGATIVE  NEGATIVE Final   Comment:            The GeneXpert MRSA Assay (FDA     approved for NASAL specimens     only), is one component of a     comprehensive MRSA colonization     surveillance program. It is not     intended to diagnose MRSA     infection nor to guide or     monitor treatment for     MRSA infections.  URINE CULTURE     Status: None   Collection Time    04/19/13  1:06 PM      Result Value Range Status   Specimen Description URINE, RANDOM   Final   Special Requests NONE   Final   Culture  Setup Time     Final   Value: 04/19/2013 22:50     Performed at [facility] Count     Final   Value: NO GROWTH     Performed at [facility]   Culture     Final   Value: NO GROWTH  Performed at [facility]   Report Status 04/20/2013 FINAL   Final  CULTURE, BLOOD (ROUTINE X 2)     Status: None   Collection Time    04/20/13  4:18 PM      Result Value Range Status   Specimen Description BLOOD RIGHT ARM   Final   Special Requests BOTTLES DRAWN AEROBIC ONLY 5CC   Final   Culture  Setup Time     Final   Value: 04/20/2013 21:17     Performed at [facility]   Culture     Final   Value:        BLOOD CULTURE RECEIVED NO GROWTH TO DATE CULTURE WILL BE HELD FOR 5 DAYS BEFORE ISSUING A FINAL NEGATIVE REPORT     Performed at [facility]   Report Status PENDING   Incomplete  CULTURE, BLOOD (ROUTINE X 2)     Status: None   Collection Time    04/20/13  4:28 PM      Result Value Range Status   Specimen Description BLOOD LEFT ARM   Final   Special Requests BOTTLES DRAWN AEROBIC ONLY 8CC   Final   Culture  Setup Time     Final   Value: 04/20/2013 21:17     Performed at [facility]   Culture     Final   Value:        BLOOD CULTURE RECEIVED NO GROWTH TO DATE CULTURE WILL BE HELD FOR 5 DAYS BEFORE ISSUING A FINAL NEGATIVE REPORT     Performed at [facility]   Report Status PENDING   Incomplete  URINE CULTURE     Status: None   Collection Time    04/20/13  5:01 PM      Result Value Range Status   Specimen Description URINE, CLEAN CATCH   Final   Special Requests NONE   Final   Culture  Setup Time     Final   Value: 04/21/2013 02:45     Performed at [facility] Count     Final   Value: NO GROWTH     Performed at [facility]   Culture     Final   Value: NO GROWTH     Performed at [facility]   Report Status 04/21/2013 FINAL   Final     Scheduled Meds: . cefTRIAXone (ROCEPHIN)  IV  2 g Intravenous Q24H  . famotidine  20 mg Oral QHS  . pantoprazole  40 mg Oral BID  . sodium chloride  3 mL Intravenous Q12H    Debe Coder, MD  [facility] Pager [phone]  If 7PM-7AM, please contact night-coverage [email or web address] Password [facility] 04/23/2013, 3:15 PM   LOS: 6 days

## 2013-04-23 NOTE — Progress Notes (Signed)
Orthopedic Tech Progress Note Patient Details:  Jay Mclean Sr. 09/22/1945 [id number]  Ortho Devices Type of Ortho Device: Roland Rack boot Ortho Device/Splint Location: (B) LE Ortho Device/Splint Interventions: Ordered;Application   Jennye Moccasin 04/23/2013, 8:12 PM

## 2013-04-23 NOTE — Evaluation (Signed)
Physical Therapy Evaluation Patient Details Name: Jay BOARDLEY Sr. MRN: [id number] DOB: [birth year] Today's Date: 04/23/2013 Time: 1478-2956 PT Time Calculation (min): 13 min  PT Assessment / Plan / Recommendation History of Present Illness  Patient is a 67 yo male admitted with chest pain, SOB, and diarrhea, diagnosed with acute gastroenteritis infection, acute renal failure, sepsis/UTI/pyelonephritis, dehydration.  Clinical Impression  Patient ambulating in room on his own.  Is at baseline functional level.  No acute PT needs - PT will sign off.  Encouraged ambulation in hallway with nursing.    PT Assessment  Patent does not need any further PT services    Follow Up Recommendations  No PT follow up;Supervision - Intermittent    Does the patient have the potential to tolerate intense rehabilitation      Barriers to Discharge        Equipment Recommendations  None recommended by PT    Recommendations for Other Services     Frequency      Precautions / Restrictions Precautions Precautions: Fall Restrictions Weight Bearing Restrictions: No   Pertinent Vitals/Pain       Mobility  Bed Mobility Bed Mobility: Not assessed Transfers Transfers: Sit to Stand;Stand to Sit Sit to Stand: 5: Supervision;From chair/3-in-1 Stand to Sit: 5: Supervision;To chair/3-in-1 Details for Transfer Assistance: No cues or assist needed. Ambulation/Gait Ambulation/Gait Assistance: 5: Supervision Ambulation Distance (Feet): 200 Feet Assistive device: None Ambulation/Gait Assistance Details: Patient with good gait pattern.  Noted staggering x2 during gait - patient able to self-correct.  Patient reports this is his baseline. Gait Pattern: Step-through pattern;Decreased stride length       PT Goals(Current goals can be found in the care plan section)  N/A  Visit Information  Last PT Received On: 04/23/13 Assistance Needed: +1 History of Present Illness: Patient is a 67 yo male  admitted with chest pain, SOB, and diarrhea, diagnosed with acute gastroenteritis infection, acute renal failure, sepsis/UTI/pyelonephritis, dehydration.       Prior Functioning  Home Living Family/patient expects to be discharged to:: Private residence Living Arrangements: Alone Available Help at Discharge: Family;Available PRN/intermittently (4 other tennants in building) Type of Home: Apartment Home Access: Level entry Home Layout: One level Home Equipment: None Prior Function Level of Independence: Independent Comments: Drives Communication Communication: No difficulties    Cognition  Cognition Arousal/Alertness: Awake/alert Behavior During Therapy: WFL for tasks assessed/performed Overall Cognitive Status: Within Functional Limits for tasks assessed    Extremity/Trunk Assessment Upper Extremity Assessment Upper Extremity Assessment: Overall WFL for tasks assessed Lower Extremity Assessment Lower Extremity Assessment: Generalized weakness (Noted edema in bil. LE's)   Balance High Level Balance High Level Balance Activites: Turns;Head turns High Level Balance Comments: No loss of balance with high level activities  End of Session PT - End of Session Activity Tolerance: Patient limited by fatigue (Mild dyspnea on exertion) Patient left: in chair;with call bell/phone within reach Nurse Communication: Mobility status (Encouraged ambulation in hallway with nursing)  GP     Vena Austria 04/23/2013, 5:51 PM Durenda Hurt. Renaldo Fiddler, [facility] Acute Rehab Services Pager [phone]

## 2013-04-23 NOTE — Progress Notes (Signed)
I have seen and examined this patient and agree with plan as outlined by Dr. Shirlee Latch.  Pt is starting to have improvement of his BUN/Scr after stopping diuresis.  Pt with ischemic ATN in setting of CHF and cirrhosis/anasarca/protein malnutrition.  Cont to follow.  Hopefully will cont to see improvement of his renal function.  He would also benefit from UNA boots to his legs.Marland Kitchen Legaci Tarman A,MD 04/23/2013 11:53 AM

## 2013-04-23 NOTE — Progress Notes (Signed)
Subjective: Patient is asking about when he can go home.  Denies sob.  He did not sleep well last night.  Denies other questions/concerns   RN: pt had trouble sleeping last night asked for something to sleep at 5:30 am  Objective: Vital signs in last 24 hours: Filed Vitals:   04/22/13 0709 04/22/13 1345 04/23/13 0100 04/23/13 0552  BP: 112/54 115/56  125/67  Pulse: 85 82  84  Temp: 97.8 F (36.6 C) 98 F (36.7 C)  98.1 F (36.7 C)  TempSrc: Oral Oral    Resp:  17  20  Height:      Weight: 244 lb (110.678 kg)  244 lb (110.678 kg)   SpO2: 99% 96%  98%   Weight change:   Intake/Output Summary (Last 24 hours) at 04/23/13 0645 Last data filed at 04/23/13 0200  Gross per 24 hour  Intake      0 ml  Output   1800 ml  Net  -1800 ml   Vitals reviewed. General: sitting in recliner, NAD, asleep and arousable HEENT: [place]/at Cardiac: RRR, possible systolic murmur Pulm: clear to auscultation bilaterally, no wheezes, rales, or rhonchi Abd: soft, nontender, mildly distended, BS present Ext: warm and well perfused, at least 2+ b/l lower ext. Edema though improved  Neuro: alert and oriented X3 (person, place, year), grossly neurologically intact   Lab Results: Basic Metabolic Panel:  Recent Labs Lab 04/21/13 0400 04/22/13 0620 04/23/13 0455  NA 136 139 134*  K 3.9 4.3 3.9  CL 103 104 100  CO2 20 24 22   GLUCOSE 93 86 96  BUN 82* 85* 80*  CREATININE 3.78* 3.97* 3.37*  CALCIUM 8.2* 8.4 8.1*  PHOS 3.8 5.2*  --    Liver Function Tests:  Recent Labs Lab 04/18/13 0441  04/22/13 0620 04/23/13 0455  AST 23  --   --  30  ALT 15  --   --  18  ALKPHOS 69  --   --  153*  BILITOT 1.3*  --   --  1.2  PROT 5.4*  --   --  5.2*  ALBUMIN 2.2*  < > 2.0* 1.8*  < > = values in this interval not displayed.CBC:  Recent Labs Lab 04/17/13 0346  04/22/13 0620 04/23/13 0455  WBC 7.2  < > 7.2 8.7  NEUTROABS 6.5  --   --   --   HGB 9.4*  < > 7.8* 7.8*  HCT 28.0*  < > 22.6* 21.6*  MCV  97.2  < > 92.6 91.5  PLT 63*  < > 50* 60*  < > = values in this interval not displayed. Cardiac Enzymes:  Recent Labs Lab 04/17/13 0955 04/17/13 1450 04/17/13 1920  TROPONINI <0.30 <0.30 <0.30   BNP:  Recent Labs Lab 04/17/13 0346  PROBNP 394.1*   D-Dimer:  Recent Labs Lab 04/17/13 0346 04/20/13 1618  DDIMER 2.45* 3.93*   Coagulation:  Recent Labs Lab 04/18/13 0441 04/20/13 1618 04/22/13 0620 04/23/13 0455  LABPROT 19.7* 18.8* 17.3* 17.1*  INR 1.72* 1.62* 1.45 1.43   Urinalysis:  Recent Labs Lab 04/19/13 1306 04/20/13 1701  COLORURINE ORANGE* AMBER*  LABSPEC 1.020 1.017  PHURINE 5.0 5.5  GLUCOSEU NEGATIVE NEGATIVE  HGBUR LARGE* MODERATE*  BILIRUBINUR SMALL* SMALL*  KETONESUR 15* NEGATIVE  PROTEINUR 100* 30*  UROBILINOGEN 1.0 1.0  NITRITE NEGATIVE NEGATIVE  LEUKOCYTESUR MODERATE* MODERATE*   Misc. Labs: ADAMS TS 13    Micro Results: Recent Results (from the past 240 hour(s))  URINE CULTURE     Status: None   Collection Time    04/17/13  7:32 AM      Result Value Range Status   Specimen Description URINE, RANDOM   Final   Special Requests NONE   Final   Culture  Setup Time     Final   Value: 04/17/2013 08:48     Performed at [facility] Count     Final   Value: >=100,000 COLONIES/ML     Performed at [facility]   Culture     Final   Value: ESCHERICHIA COLI     Performed at [facility]   Report Status 04/19/2013 FINAL   Final   Organism ID, Bacteria ESCHERICHIA COLI   Final  MRSA PCR SCREENING     Status: None   Collection Time    04/17/13  9:47 AM      Result Value Range Status   MRSA by PCR NEGATIVE  NEGATIVE Final   Comment:            The GeneXpert MRSA Assay (FDA     approved for NASAL specimens     only), is one component of a     comprehensive MRSA colonization     surveillance program. It is not     intended to diagnose MRSA     infection nor to guide or     monitor treatment for      MRSA infections.  URINE CULTURE     Status: None   Collection Time    04/19/13  1:06 PM      Result Value Range Status   Specimen Description URINE, RANDOM   Final   Special Requests NONE   Final   Culture  Setup Time     Final   Value: 04/19/2013 22:50     Performed at [facility] Count     Final   Value: NO GROWTH     Performed at [facility]   Culture     Final   Value: NO GROWTH     Performed at [facility]   Report Status 04/20/2013 FINAL   Final  CULTURE, BLOOD (ROUTINE X 2)     Status: None   Collection Time    04/20/13  4:18 PM      Result Value Range Status   Specimen Description BLOOD RIGHT ARM   Final   Special Requests BOTTLES DRAWN AEROBIC ONLY 5CC   Final   Culture  Setup Time     Final   Value: 04/20/2013 21:17     Performed at [facility]   Culture     Final   Value:        BLOOD CULTURE RECEIVED NO GROWTH TO DATE CULTURE WILL BE HELD FOR 5 DAYS BEFORE ISSUING A FINAL NEGATIVE REPORT     Performed at [facility]   Report Status PENDING   Incomplete  CULTURE, BLOOD (ROUTINE X 2)     Status: None   Collection Time    04/20/13  4:28 PM      Result Value Range Status   Specimen Description BLOOD LEFT ARM   Final   Special Requests BOTTLES DRAWN AEROBIC ONLY 8CC   Final   Culture  Setup Time     Final   Value: 04/20/2013 21:17     Performed at [facility]  Final   Value:        BLOOD CULTURE RECEIVED NO GROWTH TO DATE CULTURE WILL BE HELD FOR 5 DAYS BEFORE ISSUING A FINAL NEGATIVE REPORT     Performed at [facility]   Report Status PENDING   Incomplete  URINE CULTURE     Status: None   Collection Time    04/20/13  5:01 PM      Result Value Range Status   Specimen Description URINE, CLEAN CATCH   Final   Special Requests NONE   Final   Culture  Setup Time     Final   Value: 04/21/2013 02:45     Performed at [facility] Count     Final   Value: NO GROWTH      Performed at [facility]   Culture     Final   Value: NO GROWTH     Performed at [facility]   Report Status 04/21/2013 FINAL   Final   Studies/Results: Dg Chest 2 View  04/22/2013   *RADIOLOGY REPORT*  Clinical Data: CHF and dyspnea.  CHEST - 2 VIEW  Comparison: 04/19/2013  Findings: Improved aeration in both lungs.  Findings suggest decreased interstitial edema.  There are still coarse interstitial lung markings suggesting chronic changes or residual mild edema. Heart size is within normal limits.  Median sternotomy wires are present.  No significant pleural effusions. There are known compression deformities in the lower thoracic spine and upper lumbar spine.  IMPRESSION: Improving aeration of the lungs suggests decreasing pulmonary edema.   Original Report Authenticated By: Richarda Overlie, M.D.   US Abdomen Complete  04/22/2013   *RADIOLOGY REPORT*  Abdominal ultrasound  History:  Splenomegaly; thrombocytopenia  Comparison:  April 19, 2013  Findings:  Gallbladder is visualized in multiple projections. There are no gallstones, gallbladder wall thickening, or pericholecystic fluid collection.  There is no intrahepatic, common hepatic, common bile duct dilatation.  Pancreas is partially obscured by gas; visualized portions of pancreas appear normal.  The liver has a nodular contour with inhomogeneous echotexture consistent with underlying cirrhosis.  There is no well-defined liver mass on this study.  Spleen is enlarged.  Splenic volume measures 859 ml.  No focal splenic lesions are identified.  Kidneys bilaterally appear normal.  There is mild ascites surrounding the liver.  Aorta is nonaneurysmal.  Inferior vena cava appears patent.  Conclusion:  There is inhomogeneity to the echotexture of the liver, the liver has a nodular contour.  These changes consistent with cirrhosis.  While no focal liver masses are identified, it must be cautioned that the sensitivity of ultrasound for mass  lesions is diminished in this circumstance.  Spleen is enlarged.  There is mild ascites.  Pancreas is incompletely visualized due to gas.  Visualized portions of pancreas appear normal.  Study otherwise unremarkable.   Original Report Authenticated By: Bretta Bang, M.D.   Medications: Scheduled Meds: . cefTRIAXone (ROCEPHIN)  IV  2 g Intravenous Q24H  . famotidine  20 mg Oral QHS  . pantoprazole  40 mg Oral BID  . sodium chloride  3 mL Intravenous Q12H   Continuous Infusions:  PRN Meds:.albuterol, ALPRAZolam, guaiFENesin-dextromethorphan, HYDROcodone-acetaminophen, ondansetron (ZOFRAN) IV, ondansetron Assessment/Plan: Mr. Stockinger is a 67 y.o. yo male with a PMHX of CAD s/p CABG (1998), ? MI vs angina (per pt), AKI, hiatal hernia, upper GIB 2/2 peptic ulcer, chronic dyspnea, HTN, arthritis who was admitted to [facility] on 04/17/2013 for evaluation of  chest pain and worsening shortness of breath found to be in SVT in the ED 8/17. He also had nausea, vomiting, diarrhea, mild dry cough, prior to admission. On 04/17/2013 with patient was found to have acute renal failure, severe metabolic acidosis with gap, hypotension, E.coli UTI, supratherapeutic INR, anemia/thrombocytopenia, splenomegaly, fluid overloaded with intermittently hypotension and concern for worsening renal failure, possible DIC.   #Acute renal failure versus subacute  -Creatinine slightly trending down today and he made good urine output w/in the last 24 hours  -Initial etiology likely due to ischemic ATN, hypotension due to sepsis, volume depletion due to nausea/vomiting/diarrhea prior to admission. No M spike to indicate MM -Avoid nephrotoxic drugs  -Renal US with splenomegaly and normal kidneys  -No M spike with SPEP, total complement levels normal, ASO normal;  ANA (neg), ANCA (neg), UPEP (kappa and lambda chains)  #Severe metabolic acidosis with AG noted (resolved) -AG on admission was 15 now 12 (6/23).  He had lactic acidosis on  04/17/13 of 4.06 which could be etiology of acidosis as well as diarrhea which would cause non AG acidosis. Infection and renal failure could also have cause metabolic acidosis.  #Fluid overload likely secondary to acute on chronic diastolic CHF, cirrhosis, renal failure  -strict i/o, daily wts  -8/20 echo w/ EF of 55-60% Hepatitis negative  -hold diuretics for now making good urine and Cr. Trending down w/o diuretics  -consider ACE wraps to legs   #Hypotension with history of HTN  -BP trending up this am 125/67 -Monitor VS  #Sepsis secondary to E.coli UTI, improving  -On Rocephin  -Negative repeat urine culture, NTD blood cultures  #Normocytic Anemia  -9.4 on admission and hbg has decreased with FOBT + stools on admission -Trend CBC -GI input continue protonix bid no further w/u now  #Thrombocytopenia  -Platelets 63 on admission now 60 -Patient has remote history of alcohol but denies other drugs. Etiology could be due to splenomegaly which needs further w/u. Etiology also could be due to acute critical illness. Negative hepatitis panel  -If etiology not found consider H/O consult  -Trend CBC.  #Splenomegaly  -patient denies abdominal pain  -Etiology could be multifactorial due to ?diastolic CHF, cirrhosis, consider malignancy (CML, CLL, other) -Consider H/O consult   #Anasarca likely due to cirrhosis (noted on [place]) -Cirrhosis associated with elevated INR this admission -negative hepatitis C ab, Hep B sAg   # FOBT + on 04/17/13  -History of upper GIB due to peptic ulcer disease most recently 10/2012 when patient was taking NSAIDS, Aspirin per outside records   -Currently on PPI Protonix 40 mg bid -GI no further w/u at this moment -will need to f/u with GI outpatient in [place]   #CAD s/p CABG  -consider checking lipid panel, Consider BB in the future, statin, and possibly ACEI/ARB once acute issues resolved   #Suspected acute on chronic diastolic CHF -CXR 8/19 with  diffuse interstitial infiltrates and concern for CHF. Repeat chest Xray with improved edema -nl Echo 8/20. No comment on diastolic dysfunction though question if there is diastolic dysfunction  -Cardiology consulted. He has been lost to f/u outpatient for years. Rec ischemic evaluation outpatient and f/u outpatient  #Dyspnea  -stable.  Etiology could be due to acute on chronic diastolic CHF -[place] negative for DVT b/l   #F/E/N  -Trend RFP  -Renal diet   #DVT PPX  - scds due to thrombocytopenia     LOS: 6 days   Annett Gula [phone] 04/23/2013,  6:45 AM  Discussed with attending

## 2013-04-24 LAB — CBC WITH DIFFERENTIAL/PLATELET
Basophils Absolute: 0 10*3/uL (ref 0.0–0.1)
Basophils Relative: 0 % (ref 0–1)
Eosinophils Absolute: 0.4 10*3/uL (ref 0.0–0.7)
Lymphs Abs: 1.8 10*3/uL (ref 0.7–4.0)
MCH: 32.4 pg (ref 26.0–34.0)
Neutrophils Relative %: 71 % (ref 43–77)
Platelets: 65 10*3/uL — ABNORMAL LOW (ref 150–400)
RBC: 2.44 MIL/uL — ABNORMAL LOW (ref 4.22–5.81)
RDW: 17.3 % — ABNORMAL HIGH (ref 11.5–15.5)
WBC: 10.3 10*3/uL (ref 4.0–10.5)

## 2013-04-24 LAB — BASIC METABOLIC PANEL
Calcium: 8.7 mg/dL (ref 8.4–10.5)
GFR calc Af Amer: 22 mL/min — ABNORMAL LOW (ref >90)
GFR calc non Af Amer: 19 mL/min — ABNORMAL LOW (ref >90)
Glucose, Bld: 96 mg/dL (ref 70–99)
Potassium: 4.2 mEq/L (ref 3.5–5.1)
Sodium: 136 mEq/L (ref 135–145)

## 2013-04-24 MED ORDER — CIPROFLOXACIN HCL 500 MG PO TABS
500.0000 mg | ORAL_TABLET | Freq: Every day | ORAL | Status: DC
  Administered 2013-04-24 – 2013-04-25 (×2): 500 mg via ORAL
  Filled 2013-04-24 (×3): qty 1

## 2013-04-24 NOTE — Progress Notes (Signed)
I have seen and examined this patient and agree with plan as outlined by Dr. Shirlee Latch.  Pt with E. Coli UTI/urosepsis, anasarca, nonnephrotic proteinuria, AKI with unknown baseline Scr, low C3, splenomegaly, and pancytopenia.  Scr is trending down.  Still without etiology of constellation of symptoms.  Would recommend Heme eval and possible bone marrow and liver biopsy.  Renal biopsy may also be necessary if these do not assist with dx.  [id number] pending for HUS eval.  Pt is willing to stay for further evaluation and w/u. Ymani Porcher A,MD 04/24/2013 10:37 AM

## 2013-04-24 NOTE — Progress Notes (Signed)
[facility] PROGRESS NOTE  Jay Mclean JYN:[id number] DOB: March 02, 1946 DOA: 04/17/2013 PCP: [name], MD  Brief narrative: Jay Mclean is feeling well today, no complaints.  Unna boots to bilateral legs.   Principal Problem:   Gastroenteritis Active Problems:   Acute renal failure   Thrombocytopenia   Hypertension   Coronary atherosclerosis of native coronary artery   SIRS (systemic inflammatory response syndrome)   Dehydration   Increased anion gap metabolic acidosis   Positive D dimer   Acute CHF   Acute diastolic CHF (congestive heart failure)  Assessment/Plan:   Acute renal failure most likely due to ATN from septic and hypovolemic shock,  - UOP recorded well, 950 out yesterday - Renal function improved today to 3.12  - Continue Strict I/O and daily weights  - Nephrology consultation--> Improvement of renal function with stopping lasix. Continue to monitor.  - Low complement levels.   Severe sepsis due to UTI/pyelonephritis due to E.coli  - UC from 8/17 - + for E. Coli  - UC from 8/19 - NG  - BC X 2 from 8/20: NG  - UC from 8/20: NG  - Rocephin day 6 - stopped - Switched to PO Ciprofloxacin 500mg  q24 hours for 8 more days   Thrombocytopenia/coagulopathy  - likely multifactorial including sepsis, low grade DIC, and underlying liver disease and splenomegaly (possible CML, CLL)  - continue to treat underlying infection  - Platelets continue to improve - Liver cirrhosis on abdominal US found.  - Hepatitis B surface antigen, hepatitis C antibody--neg  - [id number] pending  - Labs are improved today, no bleeding - Hematology consult placed.   Acute diastolic CHF/CAD s/p CABG - Lasix stopped, renal function improving.  - Echocardiogram--EF 55-60%, no wall motion abnormalities  - Repeat CXR 8/22 - improved aeration and decreased edema  - cardiology has signed off, they will see outpatient  - Continues to have volume overload to legs - Unna boot  to legs.   Hypertension  - BP's continue to be within normal range off antihypertensives, hold home medications.   Increased anion gap metabolic acidosis.  - AG is 10 today , resolved  Hx of bleeding ulcers/Anemia/positive FOBT  - PUD with GIB in March 2014  - On PPI  - Hgb stable over multiple days around 7.8  - consulted GI, no need for acute EGD at this time, unless active bleeding.   Diet: Renal diet  Access: PIV  Proph: SCDs  Code Status: full  Family Communication: spoke with patient alone  Disposition Plan: pending improvement in kidney function, Hematology eval  Consultants:  Nephrology  PT/OT Hematology   Procedures/Studies:  No results found.  No new  Antibiotics:  Rocephin X 6 days  Changes to Ciprofloxacin (E coli sensitive) 500mg  q24 hours for 8 more days.   Code Status: Full Family Communication: Pt at bedside Disposition Plan: Home when medically stable   Objective: Filed Vitals:   04/23/13 0552 04/23/13 1300 04/23/13 2122 04/24/13 0526  BP: 125/67 121/63 111/45 103/44  Pulse: 84 96 88 91  Temp: 98.1 F (36.7 C) 99 F (37.2 C) 98 F (36.7 C) 98.4 F (36.9 C)  TempSrc:  Oral Oral Oral  Resp: 20 18 18 18   Height:      Weight:    234 lb 9.6 oz (106.414 kg)  SpO2: 98% 98% 98% 98%    Intake/Output Summary (Last 24 hours) at 04/24/13 1138 Last data filed at 04/24/13 0337  Gross per 24  hour  Intake      0 ml  Output    803 ml  Net   -803 ml    Exam:   General:  Pt is alert, sitting in chair  Cardiovascular: Regular rate and rhythm, systolic murmur noted  Respiratory: Clear to auscultation bilaterally,  Abdomen: Soft, non tender, mild distention, non tense  Extremities: + edema, unna boots to bilateral LE  Neuro: Grossly nonfocal  Data Reviewed: Basic Metabolic Panel:  Recent Labs Lab 04/20/13 0610 04/21/13 0400 04/22/13 0620 04/23/13 0455 04/24/13 0555  NA 138 136 139 134* 136  K 4.8 3.9 4.3 3.9 4.2  CL 105 103  104 100 103  CO2 21 20 24 22 23   GLUCOSE 94 93 86 96 96  BUN 81* 82* 85* 80* 67*  CREATININE 3.98* 3.78* 3.97* 3.37* 3.12*  CALCIUM 8.3* 8.2* 8.4 8.1* 8.7  PHOS  --  3.8 5.2*  --   --    Liver Function Tests:  Recent Labs Lab 04/18/13 0441 04/21/13 0400 04/22/13 0620 04/23/13 0455  AST 23  --   --  30  ALT 15  --   --  18  ALKPHOS 69  --   --  153*  BILITOT 1.3*  --   --  1.2  PROT 5.4*  --   --  5.2*  ALBUMIN 2.2* 2.0* 2.0* 1.8*   CBC:  Recent Labs Lab 04/20/13 0610 04/20/13 1618 04/21/13 0400 04/22/13 0620 04/23/13 0455 04/24/13 0555  WBC 7.0  --  6.0 7.2 8.7 10.3  NEUTROABS  --   --   --   --   --  7.3  HGB 8.2*  --  7.9* 7.8* 7.8* 7.9*  HCT 23.3*  --  22.2* 22.6* 21.6* 22.4*  MCV 94.0  --  92.1 92.6 91.5 91.8  PLT 48* 46* 44* 50* 60* 65*   Cardiac Enzymes:  Recent Labs Lab 04/17/13 1450 04/17/13 1920  TROPONINI <0.30 <0.30   BNP: No components found with this basename: POCBNP,  CBG: No results found for this basename: GLUCAP,  in the last 168 hours  Recent Results (from the past 240 hour(s))  URINE CULTURE     Status: None   Collection Time    04/17/13  7:32 AM      Result Value Range Status   Specimen Description URINE, RANDOM   Final   Special Requests NONE   Final   Culture  Setup Time     Final   Value: 04/17/2013 08:48     Performed at [facility] Count     Final   Value: >=100,000 COLONIES/ML     Performed at [facility]   Culture     Final   Value: ESCHERICHIA COLI     Performed at [facility]   Report Status 04/19/2013 FINAL   Final   Organism ID, Bacteria ESCHERICHIA COLI   Final  MRSA PCR SCREENING     Status: None   Collection Time    04/17/13  9:47 AM      Result Value Range Status   MRSA by PCR NEGATIVE  NEGATIVE Final   Comment:            The GeneXpert MRSA Assay (FDA     approved for NASAL specimens     only), is one component of a     comprehensive MRSA colonization      surveillance program. It is not  intended to diagnose MRSA     infection nor to guide or     monitor treatment for     MRSA infections.  URINE CULTURE     Status: None   Collection Time    04/19/13  1:06 PM      Result Value Range Status   Specimen Description URINE, RANDOM   Final   Special Requests NONE   Final   Culture  Setup Time     Final   Value: 04/19/2013 22:50     Performed at [facility] Count     Final   Value: NO GROWTH     Performed at [facility]   Culture     Final   Value: NO GROWTH     Performed at [facility]   Report Status 04/20/2013 FINAL   Final  CULTURE, BLOOD (ROUTINE X 2)     Status: None   Collection Time    04/20/13  4:18 PM      Result Value Range Status   Specimen Description BLOOD RIGHT ARM   Final   Special Requests BOTTLES DRAWN AEROBIC ONLY 5CC   Final   Culture  Setup Time     Final   Value: 04/20/2013 21:17     Performed at [facility]   Culture     Final   Value:        BLOOD CULTURE RECEIVED NO GROWTH TO DATE CULTURE WILL BE HELD FOR 5 DAYS BEFORE ISSUING A FINAL NEGATIVE REPORT     Performed at [facility]   Report Status PENDING   Incomplete  CULTURE, BLOOD (ROUTINE X 2)     Status: None   Collection Time    04/20/13  4:28 PM      Result Value Range Status   Specimen Description BLOOD LEFT ARM   Final   Special Requests BOTTLES DRAWN AEROBIC ONLY 8CC   Final   Culture  Setup Time     Final   Value: 04/20/2013 21:17     Performed at [facility]   Culture     Final   Value:        BLOOD CULTURE RECEIVED NO GROWTH TO DATE CULTURE WILL BE HELD FOR 5 DAYS BEFORE ISSUING A FINAL NEGATIVE REPORT     Performed at [facility]   Report Status PENDING   Incomplete  URINE CULTURE     Status: None   Collection Time    04/20/13  5:01 PM      Result Value Range Status   Specimen Description URINE, CLEAN CATCH   Final   Special Requests NONE   Final   Culture  Setup  Time     Final   Value: 04/21/2013 02:45     Performed at [facility] Count     Final   Value: NO GROWTH     Performed at [facility]   Culture     Final   Value: NO GROWTH     Performed at [facility]   Report Status 04/21/2013 FINAL   Final     Scheduled Meds: . ciprofloxacin  500 mg Oral Q breakfast  . famotidine  20 mg Oral QHS  . pantoprazole  40 mg Oral BID  . sodium chloride  3 mL Intravenous Q12H   Continuous Infusions:    Debe Coder, MD  [facility] Pager [phone]  If 7PM-7AM, please contact night-coverage [email or web address] Password [facility] 04/24/2013, 11:38 AM   LOS: 7 days

## 2013-04-24 NOTE — Progress Notes (Signed)
Subjective: Patient is up and ready stating he is going home today per MD (?who). Denies complaints. Breathing is improved.  Denies ab pain. He had had bowel movements and is urinating okay.    Objective: Vital signs in last 24 hours: Filed Vitals:   04/23/13 0552 04/23/13 1300 04/23/13 2122 04/24/13 0526  BP: 125/67 121/63 111/45 103/44  Pulse: 84 96 88 91  Temp: 98.1 F (36.7 C) 99 F (37.2 C) 98 F (36.7 C) 98.4 F (36.9 C)  TempSrc:  Oral Oral Oral  Resp: 20 18 18 18   Height:      Weight:    234 lb 9.6 oz (106.414 kg)  SpO2: 98% 98% 98% 98%   Weight change: -9 lb 6.4 oz (-4.264 kg)  Intake/Output Summary (Last 24 hours) at 04/24/13 0717 Last data filed at 04/24/13 4540  Gross per 24 hour  Intake    120 ml  Output    953 ml  Net   -833 ml   Vitals reviewed. General: sitting in recliner fully dressed, smells of urine, NAD, alert and oriented x 3  HEENT: Paden/at Cardiac: RRR, systolic murmur Pulm: clear to auscultation bilaterally, no wheezes, rales, or rhonchi Abd: soft, nontender, mildly distended, BS present Ext: warm and well perfused, unna boots intact with still 2+ edema legs Neuro: alert and oriented X3 (person, place, year), grossly neurologically intact   Lab Results: Basic Metabolic Panel:  Recent Labs Lab 04/21/13 0400 04/22/13 0620 04/23/13 0455 04/24/13 0555  NA 136 139 134* 136  K 3.9 4.3 3.9 4.2  CL 103 104 100 103  CO2 20 24 22 23   GLUCOSE 93 86 96 96  BUN 82* 85* 80* 67*  CREATININE 3.78* 3.97* 3.37* 3.12*  CALCIUM 8.2* 8.4 8.1* 8.7  PHOS 3.8 5.2*  --   --    Liver Function Tests:  Recent Labs Lab 04/18/13 0441  04/22/13 0620 04/23/13 0455  AST 23  --   --  30  ALT 15  --   --  18  ALKPHOS 69  --   --  153*  BILITOT 1.3*  --   --  1.2  PROT 5.4*  --   --  5.2*  ALBUMIN 2.2*  < > 2.0* 1.8*  < > = values in this interval not displayed.CBC:  Recent Labs Lab 04/23/13 0455 04/24/13 0555  WBC 8.7 10.3  NEUTROABS  --  7.3  HGB  7.8* 7.9*  HCT 21.6* 22.4*  MCV 91.5 91.8  PLT 60* 65*   Cardiac Enzymes:  Recent Labs Lab 04/17/13 0955 04/17/13 1450 04/17/13 1920  TROPONINI <0.30 <0.30 <0.30   D-Dimer:  Recent Labs Lab 04/20/13 1618  DDIMER 3.93*   Coagulation:  Recent Labs Lab 04/18/13 0441 04/20/13 1618 04/22/13 0620 04/23/13 0455  LABPROT 19.7* 18.8* 17.3* 17.1*  INR 1.72* 1.62* 1.45 1.43   Urinalysis:  Recent Labs Lab 04/19/13 1306 04/20/13 1701  COLORURINE ORANGE* AMBER*  LABSPEC 1.020 1.017  PHURINE 5.0 5.5  GLUCOSEU NEGATIVE NEGATIVE  HGBUR LARGE* MODERATE*  BILIRUBINUR SMALL* SMALL*  KETONESUR 15* NEGATIVE  PROTEINUR 100* 30*  UROBILINOGEN 1.0 1.0  NITRITE NEGATIVE NEGATIVE  LEUKOCYTESUR MODERATE* MODERATE*   Misc. Labs: ADAMS TS 13    Micro Results: Recent Results (from the past 240 hour(s))  URINE CULTURE     Status: None   Collection Time    04/17/13  7:32 AM      Result Value Range Status   Specimen Description URINE, RANDOM  Final   Special Requests NONE   Final   Culture  Setup Time     Final   Value: 04/17/2013 08:48     Performed at [facility] Count     Final   Value: >=100,000 COLONIES/ML     Performed at [facility]   Culture     Final   Value: ESCHERICHIA COLI     Performed at [facility]   Report Status 04/19/2013 FINAL   Final   Organism ID, Bacteria ESCHERICHIA COLI   Final  MRSA PCR SCREENING     Status: None   Collection Time    04/17/13  9:47 AM      Result Value Range Status   MRSA by PCR NEGATIVE  NEGATIVE Final   Comment:            The GeneXpert MRSA Assay (FDA     approved for NASAL specimens     only), is one component of a     comprehensive MRSA colonization     surveillance program. It is not     intended to diagnose MRSA     infection nor to guide or     monitor treatment for     MRSA infections.  URINE CULTURE     Status: None   Collection Time    04/19/13  1:06 PM      Result Value  Range Status   Specimen Description URINE, RANDOM   Final   Special Requests NONE   Final   Culture  Setup Time     Final   Value: 04/19/2013 22:50     Performed at [facility] Count     Final   Value: NO GROWTH     Performed at [facility]   Culture     Final   Value: NO GROWTH     Performed at [facility]   Report Status 04/20/2013 FINAL   Final  CULTURE, BLOOD (ROUTINE X 2)     Status: None   Collection Time    04/20/13  4:18 PM      Result Value Range Status   Specimen Description BLOOD RIGHT ARM   Final   Special Requests BOTTLES DRAWN AEROBIC ONLY 5CC   Final   Culture  Setup Time     Final   Value: 04/20/2013 21:17     Performed at [facility]   Culture     Final   Value:        BLOOD CULTURE RECEIVED NO GROWTH TO DATE CULTURE WILL BE HELD FOR 5 DAYS BEFORE ISSUING A FINAL NEGATIVE REPORT     Performed at [facility]   Report Status PENDING   Incomplete  CULTURE, BLOOD (ROUTINE X 2)     Status: None   Collection Time    04/20/13  4:28 PM      Result Value Range Status   Specimen Description BLOOD LEFT ARM   Final   Special Requests BOTTLES DRAWN AEROBIC ONLY 8CC   Final   Culture  Setup Time     Final   Value: 04/20/2013 21:17     Performed at [facility]   Culture     Final   Value:        BLOOD CULTURE RECEIVED NO GROWTH TO DATE CULTURE WILL BE HELD FOR 5 DAYS BEFORE ISSUING A FINAL NEGATIVE REPORT  Performed at [facility]   Report Status PENDING   Incomplete  URINE CULTURE     Status: None   Collection Time    04/20/13  5:01 PM      Result Value Range Status   Specimen Description URINE, CLEAN CATCH   Final   Special Requests NONE   Final   Culture  Setup Time     Final   Value: 04/21/2013 02:45     Performed at [facility] Count     Final   Value: NO GROWTH     Performed at [facility]   Culture     Final   Value: NO GROWTH     Performed at [facility]   Report Status 04/21/2013 FINAL   Final   Studies/Results: Dg Chest 2 View  04/22/2013   *RADIOLOGY REPORT*  Clinical Data: CHF and dyspnea.  CHEST - 2 VIEW  Comparison: 04/19/2013  Findings: Improved aeration in both lungs.  Findings suggest decreased interstitial edema.  There are still coarse interstitial lung markings suggesting chronic changes or residual mild edema. Heart size is within normal limits.  Median sternotomy wires are present.  No significant pleural effusions. There are known compression deformities in the lower thoracic spine and upper lumbar spine.  IMPRESSION: Improving aeration of the lungs suggests decreasing pulmonary edema.   Original Report Authenticated By: Richarda Overlie, M.D.   US Abdomen Complete  04/22/2013   *RADIOLOGY REPORT*  Abdominal ultrasound  History:  Splenomegaly; thrombocytopenia  Comparison:  April 19, 2013  Findings:  Gallbladder is visualized in multiple projections. There are no gallstones, gallbladder wall thickening, or pericholecystic fluid collection.  There is no intrahepatic, common hepatic, common bile duct dilatation.  Pancreas is partially obscured by gas; visualized portions of pancreas appear normal.  The liver has a nodular contour with inhomogeneous echotexture consistent with underlying cirrhosis.  There is no well-defined liver mass on this study.  Spleen is enlarged.  Splenic volume measures 859 ml.  No focal splenic lesions are identified.  Kidneys bilaterally appear normal.  There is mild ascites surrounding the liver.  Aorta is nonaneurysmal.  Inferior vena cava appears patent.  Conclusion:  There is inhomogeneity to the echotexture of the liver, the liver has a nodular contour.  These changes consistent with cirrhosis.  While no focal liver masses are identified, it must be cautioned that the sensitivity of ultrasound for mass lesions is diminished in this circumstance.  Spleen is enlarged.  There is mild ascites.  Pancreas is  incompletely visualized due to gas.  Visualized portions of pancreas appear normal.  Study otherwise unremarkable.   Original Report Authenticated By: Bretta Bang, M.D.   Medications: Scheduled Meds: . cefTRIAXone (ROCEPHIN)  IV  2 g Intravenous Q24H  . famotidine  20 mg Oral QHS  . pantoprazole  40 mg Oral BID  . sodium chloride  3 mL Intravenous Q12H   Continuous Infusions:  PRN Meds:.albuterol, ALPRAZolam, guaiFENesin-dextromethorphan, HYDROcodone-acetaminophen, ondansetron (ZOFRAN) IV, ondansetron Assessment/Plan: Jay Mclean is a 67 y.o. yo male with a PMHX of CAD s/p CABG (1998), ? MI vs angina (per pt), AKI, hiatal hernia, upper GIB 2/2 peptic ulcer, chronic dyspnea, HTN, arthritis who was admitted to [facility] on 04/17/2013 for evaluation of chest pain and worsening shortness of breath found to be in SVT in the ED 8/17. He also had nausea, vomiting, diarrhea, mild dry cough, prior to admission. On 04/17/2013 with patient was found to  have acute renal failure, severe metabolic acidosis with gap, hypotension, E.coli UTI, supratherapeutic INR, anemia/thrombocytopenia, splenomegaly, fluid overloaded with intermittently hypotension and concern for worsening renal failure, possible DIC.   #Acute renal failure versus subacute  -Creatinine trending down with good urine output w/in the last 24 hours  -Initial etiology likely due to ischemic ATN, hypotension due to sepsis, volume depletion due to nausea/vomiting/diarrhea prior to admission. No M spike to indicate MM -Avoid nephrotoxic drugs  -Renal US with splenomegaly and normal kidneys  -No M spike with SPEP, total complement levels normal, ASO normal;  ANA (neg), ANCA (neg), UPEP (kappa and lambda chains)  #Severe metabolic acidosis with AG noted (resolved) -He had lactic acidosis on 04/17/13 of 4.06 which could be etiology of acidosis as well as diarrhea which would cause non AG acidosis. Infection and renal failure could also have cause  metabolic acidosis.  #Fluid overload likely secondary to acute on chronic diastolic CHF, cirrhosis, renal failure  -strict i/o, daily wts  -8/20 echo w/ EF of 55-60% Hepatitis negative  -hold diuretics for now making good urine and Cr. Trending down w/o diuretics  -unna boots to b/l legs   #Hypotension with history of HTN  -BP trending up this am 103/44 -Monitor VS  #Sepsis secondary to E.coli UTI, improving  -On Rocephin  -Negative repeat urine culture, NTD blood cultures  #Normocytic Anemia  -9.4 on admission and hbg has decreased with FOBT + stools on admission -Trend CBC -GI input continue protonix bid no further w/u now  #Thrombocytopenia  -Platelets 65 -Patient has remote history of alcohol but denies other drugs. Etiology could be due to splenomegaly which needs further w/u. Etiology also could be due to acute critical illness. Negative hepatitis panel  -outpatient H/O consult  -Trend CBC.  #Splenomegaly  -Etiology could be multifactorial due to ?diastolic CHF, cirrhosis, consider malignancy (CML, CLL, other) -Consider H/O consult outpatient    #Anasarca likely due to cirrhosis (noted on [place]) -He will need outpatient f/u with GI   # FOBT + on 04/17/13  -History of upper GIB due to peptic ulcer disease most recently 10/2012 when patient was taking NSAIDS, Aspirin per outside records   -Currently on PPI Protonix 40 mg bid -GI no further w/u at this moment -will need to f/u with GI outpatient in [place]   #CAD s/p CABG  -consider checking lipid panel, Consider BB in the future, statin, and possibly ACEI/ARB once acute issues resolved   #Suspected acute on chronic diastolic CHF -CXR 8/19 with diffuse interstitial infiltrates and concern for CHF. Repeat chest Xray with improved edema -nl Echo 8/20. No comment on diastolic dysfunction though question if there is diastolic dysfunction  -Cardiology consulted. He has been lost to f/u outpatient for years. Rec ischemic  evaluation outpatient and f/u outpatient  #Dyspnea  -stable.  Etiology could be due to acute on chronic diastolic CHF -[place] negative for DVT b/l   #F/E/N  -Trend BMET -Renal diet   #DVT PPX  -unna boot     LOS: 7 days   Annett Gula [phone] 04/24/2013, 7:17 AM  Discussed with attending

## 2013-04-25 ENCOUNTER — Inpatient Hospital Stay (HOSPITAL_COMMUNITY): Payer: Medicare Other

## 2013-04-25 DIAGNOSIS — R6510 Systemic inflammatory response syndrome (SIRS) of non-infectious origin without acute organ dysfunction: Secondary | ICD-10-CM

## 2013-04-25 DIAGNOSIS — D473 Essential (hemorrhagic) thrombocythemia: Secondary | ICD-10-CM

## 2013-04-25 LAB — URINALYSIS W MICROSCOPIC + REFLEX CULTURE
Bilirubin Urine: NEGATIVE
Nitrite: NEGATIVE
Specific Gravity, Urine: 1.015 (ref 1.005–1.030)
Urobilinogen, UA: 1 mg/dL (ref 0.0–1.0)
pH: 6.5 (ref 5.0–8.0)

## 2013-04-25 LAB — DIRECT ANTIGLOBULIN TEST (NOT AT ARMC)
DAT, IgG: NEGATIVE
DAT, complement: NEGATIVE

## 2013-04-25 MED ORDER — VANCOMYCIN HCL 10 G IV SOLR: 1500.0000 mg | INTRAVENOUS | Status: DC

## 2013-04-25 MED ORDER — VANCOMYCIN HCL 10 G IV SOLR
2000.0000 mg | Freq: Once | INTRAVENOUS | Status: AC
  Administered 2013-04-25: 2000 mg via INTRAVENOUS
  Filled 2013-04-25: qty 2000

## 2013-04-25 MED ORDER — PIPERACILLIN-TAZOBACTAM 3.375 G IVPB 30 MIN
3.3750 g | Freq: Three times a day (TID) | INTRAVENOUS | Status: DC
  Administered 2013-04-26 (×2): 3.375 g via INTRAVENOUS
  Filled 2013-04-25 (×5): qty 50

## 2013-04-25 NOTE — Progress Notes (Signed)
ANTIBIOTIC CONSULT NOTE - INITIAL  Pharmacy Consult:  Vancomycin Indication:  Empiric for new fever  Allergies  Allergen Reactions  . Ambien [Zolpidem Tartrate] Other (See Comments)    Sleep walking and amnesia  . Aspirin     REACTION: GI BLEED  . Ibuprofen     REACTION: GI BLEED    Patient Measurements: Height: 5\' 8"  (172.7 cm) Weight: 231 lb 9.6 oz (105.053 kg) IBW/kg (Calculated) : 68.4  Vital Signs: Temp: 98.6 F (37 C) (08/25 1900) Temp src: Oral (08/25 1900) BP: 116/51 mmHg (08/25 1431) Pulse Rate: 85 (08/25 1431) Intake/Output from previous day: 08/24 0701 - 08/25 0700 In: 200 [P.O.:200] Out: 2 [Urine:1; Stool:1]  Labs:  Recent Labs  04/23/13 0455 04/24/13 0555  WBC 8.7 10.3  HGB 7.8* 7.9*  PLT 60* 65*  CREATININE 3.37* 3.12*   Estimated Creatinine Clearance: 26.3 ml/min (by C-G formula based on Cr of 3.12). No results found for this basename: Rolm Gala, VANCORANDOM, GENTTROUGH, GENTPEAK, GENTRANDOM, TOBRATROUGH, TOBRAPEAK, TOBRARND, AMIKACINPEAK, AMIKACINTROU, AMIKACIN,  in the last 72 hours   Microbiology: Recent Results (from the past 720 hour(s))  URINE CULTURE     Status: None   Collection Time    04/17/13  7:32 AM      Result Value Range Status   Specimen Description URINE, RANDOM   Final   Special Requests NONE   Final   Culture  Setup Time     Final   Value: 04/17/2013 08:48     Performed at [facility] Count     Final   Value: >=100,000 COLONIES/ML     Performed at [facility]   Culture     Final   Value: ESCHERICHIA COLI     Performed at [facility]   Report Status 04/19/2013 FINAL   Final   Organism ID, Bacteria ESCHERICHIA COLI   Final  MRSA PCR SCREENING     Status: None   Collection Time    04/17/13  9:47 AM      Result Value Range Status   MRSA by PCR NEGATIVE  NEGATIVE Final   Comment:            The GeneXpert MRSA Assay (FDA     approved for NASAL specimens     only), is  one component of a     comprehensive MRSA colonization     surveillance program. It is not     intended to diagnose MRSA     infection nor to guide or     monitor treatment for     MRSA infections.  URINE CULTURE     Status: None   Collection Time    04/19/13  1:06 PM      Result Value Range Status   Specimen Description URINE, RANDOM   Final   Special Requests NONE   Final   Culture  Setup Time     Final   Value: 04/19/2013 22:50     Performed at [facility] Count     Final   Value: NO GROWTH     Performed at [facility]   Culture     Final   Value: NO GROWTH     Performed at [facility]   Report Status 04/20/2013 FINAL   Final  CULTURE, BLOOD (ROUTINE X 2)     Status: None   Collection Time    04/20/13  4:18 PM  Result Value Range Status   Specimen Description BLOOD RIGHT ARM   Final   Special Requests BOTTLES DRAWN AEROBIC ONLY 5CC   Final   Culture  Setup Time     Final   Value: 04/20/2013 21:17     Performed at [facility]   Culture     Final   Value:        BLOOD CULTURE RECEIVED NO GROWTH TO DATE CULTURE WILL BE HELD FOR 5 DAYS BEFORE ISSUING A FINAL NEGATIVE REPORT     Performed at [facility]   Report Status PENDING   Incomplete  CULTURE, BLOOD (ROUTINE X 2)     Status: None   Collection Time    04/20/13  4:28 PM      Result Value Range Status   Specimen Description BLOOD LEFT ARM   Final   Special Requests BOTTLES DRAWN AEROBIC ONLY 8CC   Final   Culture  Setup Time     Final   Value: 04/20/2013 21:17     Performed at [facility]   Culture     Final   Value:        BLOOD CULTURE RECEIVED NO GROWTH TO DATE CULTURE WILL BE HELD FOR 5 DAYS BEFORE ISSUING A FINAL NEGATIVE REPORT     Performed at [facility]   Report Status PENDING   Incomplete  URINE CULTURE     Status: None   Collection Time    04/20/13  5:01 PM      Result Value Range Status   Specimen Description URINE, CLEAN  CATCH   Final   Special Requests NONE   Final   Culture  Setup Time     Final   Value: 04/21/2013 02:45     Performed at [facility] Count     Final   Value: NO GROWTH     Performed at [facility]   Culture     Final   Value: NO GROWTH     Performed at [facility]   Report Status 04/21/2013 FINAL   Final    Medical History: Past Medical History  Diagnosis Date  . Hypertension   . Arthritis   . Coronary atherosclerosis of native coronary artery     a. s/p remote MI;  b. 10/1996 s/p CABG  . Acute kidney failure, unspecified   . Shortness of breath   . H/O hiatal hernia   . Bilateral lower extremity edema     a. chronic  . Hyperlipidemia   . Peptic ulcer disease   . GIB (gastrointestinal bleeding)     a. h/o GIB on nsaids/asa - 2009.Follows with GI in [place]   . Chronic pain     feet and ankles        Assessment: 67 YOM admitted on 04/17/13 with gastroenteritis causing diarrhea, dehydration, hypotension, ARF and severe metabolic acidosis.  Now to start vancomycin and Zosyn for new fever in setting of recent sepsis from UTI/pyelonephritis.  His renal function is improving gradually.  Flagyl 8/14 >> 8/18 Rocephin 8/18 >> 8/24 Cipro 8/24 >> 8/25 Vanc 8/25 >> Zosyn 8/25 >>   8/20 Bcx - NGTD 8/19 UCx - NGTD 8/17 UCx - E coli (sensitive to Ancef, CIpro, Macrobid, Septra, Zosyn)   Goal of Therapy:  Vancomycin trough level 15-20 mcg/ml   Plan:  - Vanc 2mg  IV x 1, then 1500mg  IV Q48H - Zosyn 3.375gm IV Q8H,  4 hr infusion - Monitor renal function, clinical course, vanc trough at Css    Mylin Hirano D. Laney Potash, PharmD, BCPS Pager:  [phone] 04/25/2013, 7:45 PM

## 2013-04-25 NOTE — Consult Note (Signed)
[facility]  [phone] [phone]     INITIAL HEMATOLOGY CONSULTATION    Referral MD:    Reason for Referral: Thrombocythemia. Anemia.  HPI:  Patient is a 67 y.o. male who was admitted to [facility] on 04/17/2013 for evaluation of chest pain, worsening dyspnea, nausea, vomiting, diarrhea, decreased oral intake, mild dry cough. He denies sick contacts. He tried Imodium AD with relief of diarrhea.He was found to be thrombocytopenic and anemic and had FOB positive stool. His thrombocytopenia and anemia did not improved over the time and because of that hematology consult was requested.    Review of Systems  Constitutional: Negative for fever, chills, weight loss, malaise/fatigue and diaphoresis.  HENT: Positive for nosebleeds and congestion. Negative for hearing loss, sore throat, neck pain and tinnitus.   Eyes: Negative for blurred vision, double vision, photophobia and pain.  Respiratory: Positive for cough and shortness of breath. Negative for hemoptysis, sputum production and wheezing.   Cardiovascular: Positive for chest pain and leg swelling. Negative for palpitations.  Gastrointestinal: Positive for nausea, vomiting and diarrhea. Negative for abdominal pain, constipation, blood in stool and melena.  Genitourinary: Negative for dysuria, urgency, frequency and hematuria.  Musculoskeletal: Negative for myalgias, back pain and joint pain.  Skin: Negative for itching and rash.  Neurological: Positive for sensory change. Negative for dizziness, tingling, focal weakness, seizures, loss of consciousness, weakness and headaches.  Psychiatric/Behavioral: Negative.    Past Medical History  Diagnosis Date  . Hypertension   . Arthritis   . Coronary atherosclerosis of native coronary artery     a. s/p remote MI;  b. 10/1996 s/p CABG  . Acute kidney failure, unspecified   . Shortness of breath   . H/O hiatal hernia   . Bilateral lower extremity edema     a.  chronic  . Hyperlipidemia   . Peptic ulcer disease   . GIB (gastrointestinal bleeding)     a. h/o GIB on nsaids/asa - 2009.Follows with GI in [place]   . Chronic pain     feet and ankles   :    Past Surgical History  Procedure Laterality Date  . Foot surgery      s/p fall where fx'ed subtaler ankles b/l  . Coronary artery bypass graft  1998    x3  . Hiatal hernia surgery    :   CURRENT MEDS: Current Facility-Administered Medications  Medication Dose Route Frequency Provider Last Rate Last Dose  . albuterol (PROVENTIL) (5 MG/ML) 0.5% nebulizer solution 2.5 mg  2.5 mg Nebulization Q2H PRN Leroy Sea, MD   2.5 mg at 04/17/13 1932  . ALPRAZolam Prudy Feeler) tablet 0.25 mg  0.25 mg Oral QHS PRN Leroy Sea, MD   0.25 mg at 04/25/13 2129  . famotidine (PEPCID) tablet 20 mg  20 mg Oral QHS Lonia Blood, MD   20 mg at 04/25/13 2139  . guaiFENesin-dextromethorphan (ROBITUSSIN DM) 100-10 MG/5ML syrup 5 mL  5 mL Oral Q4H PRN Leroy Sea, MD      . HYDROcodone-acetaminophen (NORCO/VICODIN) 5-325 MG per tablet 1-2 tablet  1-2 tablet Oral Q4H PRN Leroy Sea, MD   1 tablet at 04/25/13 2039  . ondansetron (ZOFRAN) tablet 4 mg  4 mg Oral Q6H PRN Leroy Sea, MD       Or  . ondansetron (ZOFRAN) injection 4 mg  4 mg Intravenous Q6H PRN Leroy Sea, MD      . pantoprazole (PROTONIX)  EC tablet 40 mg  40 mg Oral BID Renae Fickle, MD   40 mg at 04/25/13 2139  . piperacillin-tazobactam (ZOSYN) IVPB 3.375 g  3.375 g Intravenous Q8H David Tat, MD      . sodium chloride 0.9 % injection 3 mL  3 mL Intravenous Q12H Leroy Sea, MD   3 mL at 04/25/13 2125  . [START ON 04/27/2013] vancomycin (VANCOCIN) 1,500 mg in sodium chloride 0.9 % 500 mL IVPB  1,500 mg Intravenous Q48H Lennon Alstrom, RPH      . vancomycin (VANCOCIN) 2,000 mg in sodium chloride 0.9 % 500 mL IVPB  2,000 mg Intravenous Once Thuy Dien Dang, RPH   2,000 mg at 04/25/13 2122      Allergies    Allergen Reactions  . Ambien [Zolpidem Tartrate] Other (See Comments)    Sleep walking and amnesia  . Aspirin     REACTION: GI BLEED  . Ibuprofen     REACTION: GI BLEED  :  Family History  Problem Relation Age of Onset  . Diabetes Father   . Renal Disease Father   . Heart attack Father     died @ 22  . Hip fracture Mother     died following hip fx @ 80.  . Other      siblings alive and well.  :  History   Social History  . Marital Status: Widowed    Spouse Name: N/A    Number of Children: N/A  . Years of Education: N/A   Occupational History  . Not on file.   Social History Main Topics  . Smoking status: Former Smoker    Types: Cigarettes    Quit date: 09/01/1982  . Smokeless tobacco: Not on file  . Alcohol Use: No     Comment: previously drank a 6 pack of beer/day but quit in 1984.  . Drug Use: No  . Sexual Activity: Not on file   Other Topics Concern  . Not on file   Social History Narrative   3 kids live in [place]   Lives alone, widowed    Former smoker    Denies alcohol (but used to drink heavily until 1984), drugs,    Used to work as Pharmacist, community   :  Exam: Patient Vitals for the past 24 hrs:  BP Temp Temp src Pulse Resp SpO2 Weight  04/25/13 2135 120/42 mmHg 99.3 F (37.4 C) Oral 91 18 98 % -  04/25/13 1900 - 98.6 F (37 C) Oral - - - -  04/25/13 1431 116/51 mmHg 101.5 F (38.6 C) Oral 85 19 100 % -  04/25/13 0552 100/43 mmHg 98.3 F (36.8 C) Oral 92 18 98 % 231 lb 9.6 oz (105.053 kg)    General:  well-nourished in no acute distress.  Eyes:  no scleral icterus.  ENT:  There were no oropharyngeal lesions.    Lymphatics:  Negative cervical, supraclavicular or axillary adenopathy.  Respiratory: lungs were clear bilaterally without wheezing or crackles.  Cardiovascular:  Regular rate and rhythm, S1/S2, without murmur, rub or gallop.  There was no pedal edema.  GI:  abdomen was soft, flat, nontender, nondistended, without organomegaly.   Muscoloskeletal:  no calf tenderness..  Skin exam was without ecchymosis, petechiae.  Neuro exam was nonfocal.  Patient was alerted and oriented.  Attention was good.   Language was appropriate.  Mood was normal without depression.  Speech was not pressured.  Thought content was not tangential.  LABS:  Lab Results  Component Value Date   WBC 10.3 04/24/2013   HGB 7.9* 04/24/2013   HCT 22.4* 04/24/2013   PLT 65* 04/24/2013   GLUCOSE 96 04/24/2013   CHOL  Value: 149        ATP III CLASSIFICATION:  <200     mg/dL   Desirable  865-784  mg/dL   Borderline High  >=696    mg/dL   High        10/10/5282   TRIG 78 04/07/2010   HDL 45 04/07/2010   LDLCALC  Value: 88        Total Cholesterol/HDL:CHD Risk Coronary Heart Disease Risk Table                     Men   Women  1/2 Average Risk   3.4   3.3  Average Risk       5.0   4.4  2 X Average Risk   9.6   7.1  3 X Average Risk  23.4   11.0        Use the calculated Patient Ratio above and the CHD Risk Table to determine the patient's CHD Risk.        ATP III CLASSIFICATION (LDL):  <100     mg/dL   Optimal  132-440  mg/dL   Near or Above                    Optimal  130-159  mg/dL   Borderline  102-725  mg/dL   High  >366     mg/dL   Very High 12/03/345   ALT 18 04/23/2013   AST 30 04/23/2013   NA 136 04/24/2013   K 4.2 04/24/2013   CL 103 04/24/2013   CREATININE 3.12* 04/24/2013   BUN 67* 04/24/2013   CO2 23 04/24/2013   INR 1.43 04/23/2013    Dg Chest 2 View  04/25/2013   *RADIOLOGY REPORT*  Clinical Data: Dyspnea, fever  CHEST - 2 VIEW  Comparison: April 22, 2013.  Findings: Cardiomediastinal silhouette appears normal.  Sternotomy wires are noted.  No pleural effusions or pneumothorax are noted. Moderate thoracic kyphosis is noted.  There is continued presence of mild central pulmonary vascular congestion and bilateral interstitial densities which are unchanged.  IMPRESSION: Stable central pulmonary vascular congestion and pulmonary edema compared to prior exam.    Original Report Authenticated By: Lupita Raider.,  M.D.   Dg Chest 2 View  04/22/2013   *RADIOLOGY REPORT*  Clinical Data: CHF and dyspnea.  CHEST - 2 VIEW  Comparison: 04/19/2013  Findings: Improved aeration in both lungs.  Findings suggest decreased interstitial edema.  There are still coarse interstitial lung markings suggesting chronic changes or residual mild edema. Heart size is within normal limits.  Median sternotomy wires are present.  No significant pleural effusions. There are known compression deformities in the lower thoracic spine and upper lumbar spine.  IMPRESSION: Improving aeration of the lungs suggests decreasing pulmonary edema.   Original Report Authenticated By: Richarda Overlie, M.D.   US Abdomen Complete  04/22/2013   *RADIOLOGY REPORT*  Abdominal ultrasound  History:  Splenomegaly; thrombocytopenia  Comparison:  April 19, 2013  Findings:  Gallbladder is visualized in multiple projections. There are no gallstones, gallbladder wall thickening, or pericholecystic fluid collection.  There is no intrahepatic, common hepatic, common bile duct dilatation.  Pancreas is partially obscured by gas; visualized portions of pancreas appear normal.  The  liver has a nodular contour with inhomogeneous echotexture consistent with underlying cirrhosis.  There is no well-defined liver mass on this study.  Spleen is enlarged.  Splenic volume measures 859 ml.  No focal splenic lesions are identified.  Kidneys bilaterally appear normal.  There is mild ascites surrounding the liver.  Aorta is nonaneurysmal.  Inferior vena cava appears patent.  Conclusion:  There is inhomogeneity to the echotexture of the liver, the liver has a nodular contour.  These changes consistent with cirrhosis.  While no focal liver masses are identified, it must be cautioned that the sensitivity of ultrasound for mass lesions is diminished in this circumstance.  Spleen is enlarged.  There is mild ascites.  Pancreas is incompletely  visualized due to gas.  Visualized portions of pancreas appear normal.  Study otherwise unremarkable.   Original Report Authenticated By: Bretta Bang, M.D.   US Renal  04/19/2013   *RADIOLOGY REPORT*  Clinical Data:  Acute renal injury  RENAL/URINARY TRACT ULTRASOUND COMPLETE  Comparison:  None  Findings:  Right Kidney:  Measures 10.7 cm.  Normal in size and parenchymal echogenicity.  No evidence of mass or hydronephrosis.  Left Kidney:  Measures 10.3 cm.Normal in size and parenchymal echogenicity.  No evidence of mass or hydronephrosis.  Bladder:  Appears normal for degree of bladder distention.  Other:  The spleen is enlarged measuring 18.5 cm in length.  The splenic volume equals 1228.3 ml.  IMPRESSION:  1.  Normal appearance of the kidneys. 2.  Splenomegaly.   Original Report Authenticated By: Signa [place], M.D.   Dg Chest Port 1 View  04/19/2013   *RADIOLOGY REPORT*  Clinical Data: Shortness of breath.  Rales on physical exam.  PORTABLE CHEST - 1 VIEW  Comparison: 04/17/2013  Findings: Decreased lung volumes seen since prior exam.  Persistent cardiomegaly noted.  Increased diffuse interstitial infiltrates, suspicious for diffuse interstitial edema.  No focal consolidation identified.  Prior median sternotomy noted.  IMPRESSION: Cardiomegaly and increased diffuse interstitial infiltrates, consistent with congestive heart failure.   Original Report Authenticated By: Myles Rosenthal, M.D.   Dg Chest Port 1 View  04/17/2013   *RADIOLOGY REPORT*  Clinical Data: Dyspnea  PORTABLE CHEST - 1 VIEW  Comparison: Prior radiograph from 12/07/2012  Findings: Median sternotomy wires are again noted.  Cardiac and mediastinal silhouettes are within normal limits.  The lungs are normally inflated.  No focal infiltrate to suggest an acute infectious pneumonitis is identified.  There is no pulmonary edema or pleural effusion.  No pneumothorax.  Bony thorax is intact.  IMPRESSION: No acute cardiopulmonary process.    Original Report Authenticated By: Rise Mu, M.D.    Blood smear review:   I personally reviewed the patient's peripheral blood smear today. There was no peripheral blast.  There was no schistocytosis, a few target cell, no rouleaux formation, tear drop cell.  There was no giant platelets or platelet clumps.      ASSESSMENT AND PLAN:  Anemia.  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 It can be secondary to infection Partial work up for multiple myeloma was done.  I will finish work up for multiple myeloma. Will check for hemolysis, iron and vitamins deficiency, DIC. Will consider bone marrow biopsy.   Thank you for this referral.

## 2013-04-25 NOTE — Progress Notes (Addendum)
[facility] PROGRESS NOTE  Ludwig Tugwell JXB:[id number] DOB: 1946/03/02 DOA: 04/17/2013 PCP: [name], MD  Assessment/Plan: Acute renal failure most likely due to ATN from septic and hypovolemic shock,  - Renal function improved today to 3.12  - Continue Strict I/O and daily weights  - Nephrology consultation--> Improvement of renal function with stopping lasix. Continue to monitor.  - Low C3, but normal C4 and CH50 -Unclear what baseline creatinine is--04/06/2010 serum creatinine 1.12 Severe sepsis due to UTI/pyelonephritis due to E.coli  -04/25/2013 new fever--101.42F--> urine and blood culture, chest x-ray, start empiric vancomycin and Zosyn pending culture data - UC from 8/17 - + for E. 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  - Hepatitis B surface antigen, hepatitis C antibody--neg  - ADAMTS13 pending but doubt HUS - Hematology consult placed.  -Peripheral smear negative for schistocytes -UPEP--no Bence-Jones protein -coags are improving Acute diastolic CHF/CAD s/p CABG  - Lasix stopped8/22/14 after one dose,  renal function improving.  - Echocardiogram--EF 55-60%, no wall motion abnormalities  - Repeat CXR 8/22 - improved aeration and decreased edema  - cardiology has signed off, they will see outpatient  - Continues to have volume overload to legs  Hypertension  - BP's continue to be within normal range off antihypertensives, hold home medications.  Increased anion gap metabolic acidosis.  - AG is 10 today , resolved  -Likely resulting from acute on  chronic renal failure Hx of bleeding ulcers/Anemia/positive FOBT  - PUD with GIB in March 2014  - On PPI  - Hgb stable over multiple days around 7.8  - consulted GI, no need for acute EGD at this time, unless active bleeding.  Positive D dimer. Patient is still tachycardic and SOB, but with rales.  - likely due to sepsis  - PE less likely as bilateral LE dopplers are negative (8/19)  - low probability of PE (Wells' score= 1.5)  -tachycardia has trended down with tx of sepsis Antibiotics:  Flagyl 8/17 >>> 8/18  Rocephin 8/18 >>8/23 cipro 8/24?>>>  Diet: Renal diet  Access: PIV  Proph: SCDs  Code Status: full  Family Communication: spoke with patient alone  Disposition Plan: pending improvement in kidney function, Hematology eval  Consultants:  Nephrology  PT/OT  Hematology Procedures/Studies:  No results found.            Procedures/Studies: Dg Chest 2 View  04/22/2013   *RADIOLOGY REPORT*  Clinical Data: CHF and dyspnea.  CHEST - 2 VIEW  Comparison: 04/19/2013  Findings: Improved aeration in both lungs.  Findings suggest decreased interstitial edema.  There are still coarse interstitial lung markings suggesting chronic changes or residual mild edema. Heart size is within normal limits.  Median sternotomy wires are present.  No significant pleural effusions. There are known compression deformities in the lower thoracic spine and upper lumbar spine.  IMPRESSION: Improving aeration of the lungs suggests decreasing pulmonary edema.   Original Report Authenticated By: Richarda Overlie, M.D.   US Abdomen Complete  04/22/2013   *RADIOLOGY REPORT*  Abdominal ultrasound  History:  Splenomegaly; thrombocytopenia  Comparison:  April 19, 2013  Findings:  Gallbladder is visualized in multiple projections. There are no gallstones, gallbladder wall thickening, or pericholecystic fluid collection.  There is no intrahepatic, common hepatic, common bile duct dilatation.  Pancreas is partially  obscured by gas; visualized portions of pancreas appear normal.  The liver has a nodular contour with inhomogeneous echotexture consistent with underlying cirrhosis.  There is no well-defined liver mass on this study.  Spleen is enlarged.  Splenic volume measures 859 ml.  No focal splenic lesions are identified.  Kidneys bilaterally appear normal.  There is mild ascites surrounding the liver.  Aorta is nonaneurysmal.  Inferior vena cava appears patent.  Conclusion:  There is inhomogeneity to the echotexture of the liver, the liver has a nodular contour.  These changes consistent with cirrhosis.  While no focal liver masses are identified, it must be cautioned that the sensitivity of ultrasound for mass lesions is diminished in this circumstance.  Spleen is enlarged.  There is mild ascites.  Pancreas is incompletely visualized due to gas.  Visualized portions of pancreas appear normal.  Study otherwise unremarkable.   Original Report Authenticated By: Bretta Bang, M.D.   US Renal  04/19/2013   *RADIOLOGY REPORT*  Clinical Data:  Acute renal injury  RENAL/URINARY TRACT ULTRASOUND COMPLETE  Comparison:  None  Findings:  Right Kidney:  Measures 10.7 cm.  Normal in size and parenchymal echogenicity.  No evidence of mass or hydronephrosis.  Left Kidney:  Measures 10.3 cm.Normal in size and parenchymal echogenicity.  No evidence of mass or hydronephrosis.  Bladder:  Appears normal for degree of bladder distention.  Other:  The spleen is enlarged measuring 18.5 cm in length.  The splenic volume equals 1228.3 ml.  IMPRESSION:  1.  Normal appearance of the kidneys. 2.  Splenomegaly.   Original Report Authenticated By: Signa Kell, M.D.   Dg Chest Port 1 View  04/19/2013   *RADIOLOGY REPORT*  Clinical Data: Shortness of breath.  Rales on physical exam.  PORTABLE CHEST - 1 VIEW  Comparison: 04/17/2013  Findings: Decreased lung volumes seen since prior exam.  Persistent cardiomegaly noted.  Increased diffuse  interstitial infiltrates, suspicious for diffuse interstitial edema.  No focal consolidation identified.  Prior median sternotomy noted.  IMPRESSION: Cardiomegaly and increased diffuse interstitial infiltrates, consistent with congestive heart failure.   Original Report Authenticated By: Myles Rosenthal, M.D.   Dg Chest Port 1 View  04/17/2013   *RADIOLOGY REPORT*  Clinical Data: Dyspnea  PORTABLE CHEST - 1 VIEW  Comparison: Prior radiograph from 12/07/2012  Findings: Median sternotomy wires are again noted.  Cardiac and mediastinal silhouettes are within normal limits.  The lungs are normally inflated.  No focal infiltrate to suggest an acute infectious pneumonitis is identified.  There is no pulmonary edema or pleural effusion.  No pneumothorax.  Bony thorax is intact.  IMPRESSION: No acute cardiopulmonary process.   Original Report Authenticated By: Rise Mu, M.D.         Subjective: Patient continues to have some dyspnea on exertion. Denies fevers, chills, chest discomfort, nausea, vomiting, diarrhea, abdominal pain.  Objective: Filed Vitals:   04/24/13 1416 04/24/13 2102 04/25/13 0552 04/25/13 1431  BP: 111/41 101/49 100/43 116/51  Pulse: 87 79 92 85  Temp: 98 F (36.7 C) 97.7 F (36.5 C) 98.3 F (36.8 C) 101.5 F (38.6 C)  TempSrc: Oral Oral Oral Oral  Resp: 18 18 18 19   Height:      Weight:  105.053 kg (231 lb 9.6 oz)   SpO2: 97% 100% 98% 100%    Intake/Output Summary (Last 24 hours) at 04/25/13 1821 Last data filed at 04/25/13 1700  Gross per 24 hour  Intake    280 ml  Output    260 ml  Net     20 ml   Weight change: -1.361 kg (-3 lb) Exam:   General:  Pt is alert, follows commands appropriately, not in acute distress  HEENT: No icterus, No thrush,  [place]/AT  Cardiovascular: RRR, S1/S2, no rubs, no gallops  Respiratory: Bibasilar crackles, left greater than right. No wheezes. Good air movement  Abdomen: Soft/+BS, non tender, non distended, no  guarding  Extremities: 2+ edema, No lymphangitis, No petechiae, No rashes, no synovitis  Data Reviewed: Basic Metabolic Panel:  Recent Labs Lab 04/20/13 0610 04/21/13 0400 04/22/13 0620 04/23/13 0455 04/24/13 0555  NA 138 136 139 134* 136  K 4.8 3.9 4.3 3.9 4.2  CL 105 103 104 100 103  CO2 21 20 24 22 23   GLUCOSE 94 93 86 96 96  BUN 81* 82* 85* 80* 67*  CREATININE 3.98* 3.78* 3.97* 3.37* 3.12*  CALCIUM 8.3* 8.2* 8.4 8.1* 8.7  PHOS  --  3.8 5.2*  --   --    Liver Function Tests:  Recent Labs Lab 04/21/13 0400 04/22/13 0620 04/23/13 0455  AST  --   --  30  ALT  --   --  18  ALKPHOS  --   --  153*  BILITOT  --   --  1.2  PROT  --   --  5.2*  ALBUMIN 2.0* 2.0* 1.8*   No results found for this basename: LIPASE, AMYLASE,  in the last 168 hours No results found for this basename: AMMONIA,  in the last 168 hours CBC:  Recent Labs Lab 04/20/13 0610 04/20/13 1618 04/21/13 0400 04/22/13 0620 04/23/13 0455 04/24/13 0555  WBC 7.0  --  6.0 7.2 8.7 10.3  NEUTROABS  --   --   --   --   --  7.3  HGB 8.2*  --  7.9* 7.8* 7.8* 7.9*  HCT 23.3*  --  22.2* 22.6* 21.6* 22.4*  MCV 94.0  --  92.1 92.6 91.5 91.8  PLT 48* 46* 44* 50* 60* 65*   Cardiac Enzymes: No results found for this basename: CKTOTAL, CKMB, CKMBINDEX, TROPONINI,  in the last 168 hours BNP: No components found with this basename: POCBNP,  CBG: No results found for this basename: GLUCAP,  in the last 168 hours  Recent Results (from the past 240 hour(s))  URINE CULTURE     Status: None   Collection Time    04/17/13  7:32 AM      Result Value Range Status   Specimen Description URINE, RANDOM   Final   Special Requests NONE   Final   Culture  Setup Time     Final   Value: 04/17/2013 08:48     Performed at [facility] Count     Final   Value: >=100,000 COLONIES/ML     Performed at [facility]   Culture     Final   Value: ESCHERICHIA COLI     Performed at [facility]    Report Status 04/19/2013 FINAL   Final   Organism ID, Bacteria ESCHERICHIA COLI   Final  MRSA PCR SCREENING     Status: None   Collection Time  04/17/13  9:47 AM      Result Value Range Status   MRSA by PCR NEGATIVE  NEGATIVE Final   Comment:            The GeneXpert MRSA Assay (FDA     approved for NASAL specimens     only), is one component of a     comprehensive MRSA colonization     surveillance program. It is not     intended to diagnose MRSA     infection nor to guide or     monitor treatment for     MRSA infections.  URINE CULTURE     Status: None   Collection Time    04/19/13  1:06 PM      Result Value Range Status   Specimen Description URINE, RANDOM   Final   Special Requests NONE   Final   Culture  Setup Time     Final   Value: 04/19/2013 22:50     Performed at [facility] Count     Final   Value: NO GROWTH     Performed at [facility]   Culture     Final   Value: NO GROWTH     Performed at [facility]   Report Status 04/20/2013 FINAL   Final  CULTURE, BLOOD (ROUTINE X 2)     Status: None   Collection Time    04/20/13  4:18 PM      Result Value Range Status   Specimen Description BLOOD RIGHT ARM   Final   Special Requests BOTTLES DRAWN AEROBIC ONLY 5CC   Final   Culture  Setup Time     Final   Value: 04/20/2013 21:17     Performed at [facility]   Culture     Final   Value:        BLOOD CULTURE RECEIVED NO GROWTH TO DATE CULTURE WILL BE HELD FOR 5 DAYS BEFORE ISSUING A FINAL NEGATIVE REPORT     Performed at [facility]   Report Status PENDING   Incomplete  CULTURE, BLOOD (ROUTINE X 2)     Status: None   Collection Time    04/20/13  4:28 PM      Result Value Range Status   Specimen Description BLOOD LEFT ARM   Final   Special Requests BOTTLES DRAWN AEROBIC ONLY 8CC   Final   Culture  Setup Time     Final   Value: 04/20/2013 21:17     Performed at [facility]   Culture     Final    Value:        BLOOD CULTURE RECEIVED NO GROWTH TO DATE CULTURE WILL BE HELD FOR 5 DAYS BEFORE ISSUING A FINAL NEGATIVE REPORT     Performed at [facility]   Report Status PENDING   Incomplete  URINE CULTURE     Status: None   Collection Time    04/20/13  5:01 PM      Result Value Range Status   Specimen Description URINE, CLEAN CATCH   Final   Special Requests NONE   Final   Culture  Setup Time     Final   Value: 04/21/2013 02:45     Performed at [facility] Count     Final   Value: NO GROWTH     Performed at [facility]  Final   Value: NO GROWTH     Performed at [facility]   Report Status 04/21/2013 FINAL   Final     Scheduled Meds: . ciprofloxacin  500 mg Oral Q breakfast  . famotidine  20 mg Oral QHS  . pantoprazole  40 mg Oral BID  . sodium chloride  3 mL Intravenous Q12H   Continuous Infusions:    Ronav Furney, DO  [facility] Pager [phone]  If 7PM-7AM, please contact night-coverage [email or web address] Password [facility] 04/25/2013, 6:21 PM   LOS: 8 days

## 2013-04-25 NOTE — Evaluation (Signed)
Occupational Therapy Evaluation Patient Details Name: Jay WYNDHAM Sr. MRN: [id number] DOB: 03/16/1946 Today's Date: 04/25/2013 Time: 4098-1191 OT Time Calculation (min): 14 min  OT Assessment / Plan / Recommendation History of present illness Patient is a 67 yo male admitted with chest pain, SOB, and diarrhea, diagnosed with acute gastroenteritis infection, acute renal failure, sepsis/UTI/pyelonephritis, dehydration.   Clinical Impression   Pt is independent in ADL and ambulation without DME.  No further OT needs.  Signing off.    OT Assessment  Patient does not need any further OT services    Follow Up Recommendations  No OT follow up;Supervision - Intermittent    Barriers to Discharge      Equipment Recommendations  None recommended by OT    Recommendations for Other Services    Frequency       Precautions / Restrictions     Pertinent Vitals/Pain VSS, no pain.    ADL  Eating/Feeding: Independent Where Assessed - Eating/Feeding: Chair Grooming: Wash/dry hands;Independent Where Assessed - Grooming: Unsupported standing Upper Body Bathing: Independent Where Assessed - Upper Body Bathing: Unsupported standing Lower Body Bathing: Independent Where Assessed - Lower Body Bathing: Supported standing Upper Body Dressing: Independent Where Assessed - Upper Body Dressing: Unsupported sitting Lower Body Dressing: Independent Where Assessed - Lower Body Dressing: Unsupported sitting;Supported sit to stand Toilet Transfer: Independent Toilet Transfer Method: Sit to Barista: Regular height toilet;Grab bars Toileting - Clothing Manipulation and Hygiene: Independent Where Assessed - Engineer, mining and Hygiene: Sit to stand from 3-in-1 or toilet Transfers/Ambulation Related to ADLs: independent, no device ADL Comments: Able to cross foot over opposite knee to reach feet to donn and doff socks.    OT Diagnosis:    OT Problem List:    OT Treatment Interventions:     OT Goals(Current goals can be found in the care plan section) Acute Rehab OT Goals Patient Stated Goal: Home today  Visit Information  Last OT Received On: 04/25/13 History of Present Illness: Patient is a 67 yo male admitted with chest pain, SOB, and diarrhea, diagnosed with acute gastroenteritis infection, acute renal failure, sepsis/UTI/pyelonephritis, dehydration.       Prior Functioning     Home Living Family/patient expects to be discharged to:: Private residence Living Arrangements: Alone Available Help at Discharge: Family;Available PRN/intermittently Type of Home: Apartment Home Access: Level entry Home Layout: One level Home Equipment: None Prior Function Level of Independence: Independent Comments: Drives Communication Communication: No difficulties Dominant Hand: Right         Vision/Perception Vision - History Baseline Vision: Wears glasses for distance only Patient Visual Report: No change from baseline   Cognition  Cognition Arousal/Alertness: Awake/alert Behavior During Therapy: WFL for tasks assessed/performed Overall Cognitive Status: Within Functional Limits for tasks assessed    Extremity/Trunk Assessment Upper Extremity Assessment Upper Extremity Assessment: Overall WFL for tasks assessed Lower Extremity Assessment Lower Extremity Assessment: Defer to PT evaluation Cervical / Trunk Assessment Cervical / Trunk Assessment: Normal     Mobility Bed Mobility Bed Mobility: Not assessed Transfers Transfers: Sit to Stand;Stand to Sit Sit to Stand: 7: Independent;With upper extremity assist;From chair/3-in-1 Stand to Sit: 7: Independent;With upper extremity assist;To chair/3-in-1 Details for Transfer Assistance: No cues or assist needed.     Exercise     Balance     End of Session OT - End of Session Activity Tolerance: Patient tolerated treatment well Patient left: in chair;with call bell/phone within  reach  GO  Evern Bio 04/25/2013, 12:04 PM [phone]

## 2013-04-25 NOTE — Progress Notes (Signed)
Acute Kidney Injury, hemodynamically mediated (? Baseline) Plan: will follow (he wants to go home)  Subjective: Interval History:   Objective: Vital signs in last 24 hours: Temp:  [97.7 F (36.5 C)-98.3 F (36.8 C)] 98.3 F (36.8 C) (08/25 0552) Pulse Rate:  [79-92] 92 (08/25 0552) Resp:  [18] 18 (08/25 0552) BP: (100-111)/(41-49) 100/43 mmHg (08/25 0552) SpO2:  [97 %-100 %] 98 % (08/25 0552) Weight:  [105.053 kg (231 lb 9.6 oz)] 105.053 kg (231 lb 9.6 oz) (08/25 0552) Weight change: -1.361 kg (-3 lb)  Intake/Output from previous day: 08/24 0701 - 08/25 0700 In: 200 [P.O.:200] Out: 2 [Urine:1; Stool:1] Intake/Output this shift: Total I/O In: 120 [P.O.:120] Out: -   General appearance: alert and cooperative Back: negative, symmetric, no curvature. ROM normal. No CVA tenderness. Resp: diminished breath sounds bilaterally Chest wall: no tenderness Cardio: regular rate and rhythm, S1, S2 normal, no murmur, click, rub or gallop Extremities: edema tr- 1+  Lab Results:  Recent Labs  04/23/13 0455 04/24/13 0555  WBC 8.7 10.3  HGB 7.8* 7.9*  HCT 21.6* 22.4*  PLT 60* 65*   BMET:  Recent Labs  04/23/13 0455 04/24/13 0555  NA 134* 136  K 3.9 4.2  CL 100 103  CO2 22 23  GLUCOSE 96 96  BUN 80* 67*  CREATININE 3.37* 3.12*  CALCIUM 8.1* 8.7   No results found for this basename: PTH,  in the last 72 hours Iron Studies: No results found for this basename: IRON, TIBC, TRANSFERRIN, FERRITIN,  in the last 72 hours Studies/Results: No results found.  Scheduled: . ciprofloxacin  500 mg Oral Q breakfast  . famotidine  20 mg Oral QHS  . pantoprazole  40 mg Oral BID  . sodium chloride  3 mL Intravenous Q12H    LOS: 8 days   Chivonne Rascon C 04/25/2013,1:07 PM

## 2013-04-26 DIAGNOSIS — A419 Sepsis, unspecified organism: Secondary | ICD-10-CM

## 2013-04-26 LAB — CBC
MCH: 31.8 pg (ref 26.0–34.0)
MCHC: 33.5 g/dL (ref 30.0–36.0)
MCV: 94.8 fL (ref 78.0–100.0)
Platelets: 107 10*3/uL — ABNORMAL LOW (ref 150–400)
RDW: 17.9 % — ABNORMAL HIGH (ref 11.5–15.5)

## 2013-04-26 LAB — ADAMTS13 ACTIVITY

## 2013-04-26 LAB — CULTURE, BLOOD (ROUTINE X 2)
Culture: NO GROWTH
Culture: NO GROWTH

## 2013-04-26 LAB — BASIC METABOLIC PANEL
CO2: 21 mEq/L (ref 19–32)
Calcium: 8.4 mg/dL (ref 8.4–10.5)
Creatinine, Ser: 2.48 mg/dL — ABNORMAL HIGH (ref 0.50–1.35)
GFR calc non Af Amer: 25 mL/min — ABNORMAL LOW (ref >90)
Sodium: 136 mEq/L (ref 135–145)

## 2013-04-26 LAB — IRON AND TIBC
Saturation Ratios: 39 % (ref 20–55)
UIBC: 141 ug/dL (ref 125–400)

## 2013-04-26 LAB — PROTIME-INR: Prothrombin Time: 16.7 seconds — ABNORMAL HIGH (ref 11.6–15.2)

## 2013-04-26 LAB — FERRITIN: Ferritin: 173 ng/mL (ref 22–322)

## 2013-04-26 LAB — HAPTOGLOBIN: Haptoglobin: 151 mg/dL (ref 45–215)

## 2013-04-26 MED ORDER — FUROSEMIDE 80 MG PO TABS: 80.0000 mg | ORAL_TABLET | Freq: Two times a day (BID) | ORAL | Status: DC

## 2013-04-26 MED ORDER — CIPROFLOXACIN HCL 500 MG PO TABS: 500.0000 mg | ORAL_TABLET | Freq: Every day | ORAL | Status: DC

## 2013-04-26 NOTE — Progress Notes (Signed)
Acute Kidney Injury, hemodynamically mediated (? Baseline)  Plan: have  Scheduled appt  at [facility] with Dr. Lowell Guitar at 11:00, on Sept 10 Subjective: Interval History: Stable  Objective: Vital signs in last 24 hours: Temp:  [98.1 F (36.7 C)-101.5 F (38.6 C)] 98.1 F (36.7 C) (08/26 0600) Pulse Rate:  [85-91] 85 (08/26 0600) Resp:  [18-19] 18 (08/26 0600) BP: (107-120)/(42-51) 107/42 mmHg (08/26 0600) SpO2:  [98 %-100 %] 100 % (08/26 0600) Weight:  [104.645 kg (230 lb 11.2 oz)] 104.645 kg (230 lb 11.2 oz) (08/26 0600) Weight change: -0.408 kg (-14.4 oz)  Intake/Output from previous day: 08/25 0701 - 08/26 0700 In: 830 [P.O.:330; IV Piggyback:500] Out: 635 [Urine:635] Intake/Output this shift: Total I/O In: 420 [P.O.:320; IV Piggyback:100] Out: -   General appearance: alert and cooperative Resp: clear to auscultation bilaterally Cardio: regular rate and rhythm, S1, S2 normal, no murmur, click, rub or gallop Extremities: edema 2-3+ bilat  Lab Results:  Recent Labs  04/24/13 0555 04/26/13 0532  WBC 10.3 7.9  HGB 7.9* 7.4*  HCT 22.4* 22.1*  PLT 65* 107*   BMET:  Recent Labs  04/24/13 0555 04/26/13 0532  NA 136 136  K 4.2 4.3  CL 103 106  CO2 23 21  GLUCOSE 96 86  BUN 67* 49*  CREATININE 3.12* 2.48*  CALCIUM 8.7 8.4   No results found for this basename: PTH,  in the last 72 hours Iron Studies: No results found for this basename: IRON, TIBC, TRANSFERRIN, FERRITIN,  in the last 72 hours Studies/Results: Dg Chest 2 View  04/25/2013   *RADIOLOGY REPORT*  Clinical Data: Dyspnea, fever  CHEST - 2 VIEW  Comparison: April 22, 2013.  Findings: Cardiomediastinal silhouette appears normal.  Sternotomy wires are noted.  No pleural effusions or pneumothorax are noted. Moderate thoracic kyphosis is noted.  There is continued presence of mild central pulmonary vascular congestion and bilateral interstitial densities which are unchanged.  IMPRESSION: Stable central pulmonary  vascular congestion and pulmonary edema compared to prior exam.   Original Report Authenticated By: Lupita Raider.,  M.D.   Scheduled: . famotidine  20 mg Oral QHS  . pantoprazole  40 mg Oral BID  . piperacillin-tazobactam  3.375 g Intravenous Q8H  . sodium chloride  3 mL Intravenous Q12H  . [START ON 04/27/2013] vancomycin  1,500 mg Intravenous Q48H     LOS: 9 days   Nickie Warwick C 04/26/2013,11:43 AM

## 2013-04-26 NOTE — Progress Notes (Signed)
Pt. Determined to leave today.  States that his ride is coming to pick him up at 3pm.  Dr. Arbutus Leas has been notified and AMA form has been signed.

## 2013-04-26 NOTE — Progress Notes (Signed)
Advanced Heart Failure Rounding Note   Subjective:    67 y/o male with h/o CAD s/p CABG in 1998. He was lost to f/u and over the years ("at least 7 or 8") he's had dyspnea on exertion along with chronic bilateral lower ext edema. He recently reestablished cardiology care in our [place] office with Junius Argyle, MD, and was placed on HCTZ (03/22/13). An echo and cardiolite were ordered however patient no-showed for both tests (I confirmed this with our [place] office). He says that he was in his USOH until this past Saturday afternoon/evening (though H&P indicates that Ss began 3-4 days prior to admission), when he began to experience profound diarrhea and dyspnea. He presented to the [facility] ED where creatinine was elevated @ 3.05, D dimer was elevated @ 2.45, and he was tachycardic. He was anemic and thrombocytopenic with FOB + stool. ECG was non-acute and troponin was nl. UA was abnl and culture subsequently grew out E coli. He was admitted by IM and home diuretic dose was held. He was aggressively hydrated and placed on abx for UTI/pyelonephritis. Renal Ultrasound - no hydropnephrosis. R and LLE negative for DVT.    Initially hydrated with IV fluids due to N/V/D prior to admit. Later started on IV lasix which was later stopped as he was thought to be dry. Renal function improving off diuretics. Overall his weight is down 15 pounds.   Wants to go home. Mild dyspnea with exertion.   Creatinine (Admit 3.98)> 2.48 IFE- no monoclonal free chains     Objective:   Weight Range:  Vital Signs:   Temp:  [98.1 F (36.7 C)-101.5 F (38.6 C)] 98.1 F (36.7 C) (08/26 0600) Pulse Rate:  [85-91] 85 (08/26 0600) Resp:  [18-19] 18 (08/26 0600) BP: (107-120)/(42-51) 107/42 mmHg (08/26 0600) SpO2:  [98 %-100 %] 100 % (08/26 0600) Weight:  [230 lb 11.2 oz (104.645 kg)] 230 lb 11.2 oz (104.645 kg) (08/26 0600) Last BM Date: 04/23/13  Weight change: Filed Weights   04/24/13 0526 04/25/13 0552 04/26/13 0600   Weight: 234 lb 9.6 oz (106.414 kg) 231 lb 9.6 oz (105.053 kg) 230 lb 11.2 oz (104.645 kg)    Intake/Output:   Intake/Output Summary (Last 24 hours) at 04/26/13 1118 Last data filed at 04/26/13 0905  Gross per 24 hour  Intake   1130 ml  Output    635 ml  Net    495 ml     Physical Exam: General:  Sitting in chair. Eldery appearing. No resp difficulty HEENT: normal Neck: supple. JVP 9-10 . Carotids 2+ bilat; no bruits. No lymphadenopathy or thryomegaly appreciated. Cor: PMI nondisplaced. Regular rate & rhythm. No rubs, gallops or murmurs. Lungs: clear Abdomen:obese,  soft, nontender, nondistended. No hepatosplenomegaly. No bruits or masses. Good bowel sounds. Extremities: no cyanosis, clubbing, rash, R and LLE 3+ edema Neuro: alert & orientedx3, cranial nerves grossly intact. moves all 4 extremities w/o difficulty. Affect pleasant  Telemetry:  Labs: Basic Metabolic Panel:  Recent Labs Lab 04/20/13 0610 04/21/13 0400 04/22/13 0620 04/23/13 0455 04/24/13 0555 04/26/13 0532  NA 138 136 139 134* 136 136  K 4.8 3.9 4.3 3.9 4.2 4.3  CL 105 103 104 100 103 106  CO2 21 20 24 22 23 21   GLUCOSE 94 93 86 96 96 86  BUN 81* 82* 85* 80* 67* 49*  CREATININE 3.98* 3.78* 3.97* 3.37* 3.12* 2.48*  CALCIUM 8.3* 8.2* 8.4 8.1* 8.7 8.4  PHOS  --  3.8 5.2*  --   --   --  Liver Function Tests:  Recent Labs Lab 04/21/13 0400 04/22/13 0620 04/23/13 0455  AST  --   --  30  ALT  --   --  18  ALKPHOS  --   --  153*  BILITOT  --   --  1.2  PROT  --   --  5.2*  ALBUMIN 2.0* 2.0* 1.8*   No results found for this basename: LIPASE, AMYLASE,  in the last 168 hours No results found for this basename: AMMONIA,  in the last 168 hours  CBC:  Recent Labs Lab 04/21/13 0400 04/22/13 0620 04/23/13 0455 04/24/13 0555 04/26/13 0532  WBC 6.0 7.2 8.7 10.3 7.9  NEUTROABS  --   --   --  7.3  --   HGB 7.9* 7.8* 7.8* 7.9* 7.4*  HCT 22.2* 22.6* 21.6* 22.4* 22.1*  MCV 92.1 92.6 91.5 91.8  94.8  PLT 44* 50* 60* 65* 107*    Cardiac Enzymes: No results found for this basename: CKTOTAL, CKMB, CKMBINDEX, TROPONINI,  in the last 168 hours  BNP: BNP (last 3 results)  Recent Labs  04/17/13 0346  PROBNP 394.1*     Other results:     Imaging: Dg Chest 2 View  04/25/2013   *RADIOLOGY REPORT*  Clinical Data: Dyspnea, fever  CHEST - 2 VIEW  Comparison: April 22, 2013.  Findings: Cardiomediastinal silhouette appears normal.  Sternotomy wires are noted.  No pleural effusions or pneumothorax are noted. Moderate thoracic kyphosis is noted.  There is continued presence of mild central pulmonary vascular congestion and bilateral interstitial densities which are unchanged.  IMPRESSION: Stable central pulmonary vascular congestion and pulmonary edema compared to prior exam.   Original Report Authenticated By: Lupita Raider.,  M.D.      Medications:     Scheduled Medications: . famotidine  20 mg Oral QHS  . pantoprazole  40 mg Oral BID  . piperacillin-tazobactam  3.375 g Intravenous Q8H  . sodium chloride  3 mL Intravenous Q12H  . [START ON 04/27/2013] vancomycin  1,500 mg Intravenous Q48H     Infusions:     PRN Medications:  albuterol, ALPRAZolam, guaiFENesin-dextromethorphan, HYDROcodone-acetaminophen, ondansetron (ZOFRAN) IV, ondansetron   Assessment:   1. A/C diastolic heart failure ECHO EF 55-60% 2.AKI 3. Thrombocythemia/Anemia- followed by Hematology.   No monclonal free chains  4.UTI/Pyelonephritits-On vancomycin and zosyn  5. + D-Dimer- No CT of chest due to CKD.  6. + Stool/anemia   GI consult 04/22/13 no active signs of bleeding . On protonix 6. CAD  CABG 1998 7. H/O GI bleed   Plan/Discussion:     Mild volume overload noted off diuretics. Add lasix 80 mg po daily and watch renal function closely.  Consult nutrition.   Will need HH once discharged.  Instructed to weight and record daily. Will need to limit fluid intake to < 2 liters per day.  Would like to see him in the HF clinic once week post discharge to make sure his diuretic regimen is ok.     Length of Stay: 9  CLEGG,AMY 04/26/2013, 11:18 AM  Advanced Heart Failure Team Pager [phone] (M-F; 7a - 4p)  Please contact  Cardiology for night-coverage after hours (4p -7a ) and weekends on [email or web address]  Patient left AMA prior to me being able to staff this consult with Ms. Clegg. I agree with above. He appears to remain very volume overloaded but diuresis limited by renal failure. I have d/w Dr. Arbutus Leas by phone and suggested placing central access  for closer monitoring and possibly inotropic support (dopamine) to facilitate diuresis but patient refuses to stay any longer. We have suggested lasix 80 po bid and f/u in the HF Clinic within the next week.  Daniel Bensimhon,MD 10:11 PM

## 2013-04-26 NOTE — Plan of Care (Signed)
Problem: Food- and Nutrition-Related Knowledge Deficit (NB-1.1) Goal: Nutrition education Formal process to instruct or train a patient/client in a skill or to impart knowledge to help patients/clients voluntarily manage or modify food choices and eating behavior to maintain or improve health. Outcome: Completed/Met Date Met:  04/26/13  Nutrition Education Note  RD consulted for nutrition education regarding CHF and renal nutrition therapy.  RD provided "Low Sodium Nutrition Therapy" handout from the [employer]. Reviewed patient's dietary recall. Provided examples on ways to decrease sodium intake in diet. Discouraged intake of processed foods and use of salt shaker. Encouraged fresh fruits and vegetables as well as whole grain sources of carbohydrates to maximize fiber intake.   RD discussed why it is important for patient to adhere to diet recommendations, and emphasized the role of fluids, foods to avoid, and importance of weighing self daily. Teach back method used.  Expect poor compliance. Pt is currently leaving AMA.  Body mass index is 35.09 kg/(m^2). Pt meets criteria for Obese Class II based on current BMI.  Current diet order is Heart Healthy, patient is consuming approximately 100% of meals at this time. Labs and medications reviewed. No further nutrition interventions warranted at this time. RD contact information provided. If additional nutrition issues arise, please re-consult RD.   Jarold Motto MS, RD, LDN Pager: [phone] After-hours pager: [phone]

## 2013-04-26 NOTE — Discharge Summary (Addendum)
Physician Discharge Summary  Jay Mclean ZOX:[id number] DOB: Dec 20, 1945 DOA: 04/17/2013  PCP: [name], MD  Admit date: 04/17/2013 Discharge date: 04/26/2013  Patient is leaving AGAINST MEDICAL ADVICE Patient was seen by me today and the risks, benefits, and alternatives were discussed with the patient. Risks involving but not limited to worsening shortness of breath, chest pain, and death were discussed. The patient expressed understanding and is willing to accept these risks.   Recommendations for Outpatient Follow-up:  1. Pt will need to follow up with PCP in 2 weeks post discharge 2. Please obtain BMP to evaluate electrolytes and kidney function 3. Please also check CBC to evaluate Hg and Hct levels 4. Followup at heart failure clinic--05/05/2013@10 :20 AM 5. Followup with nephrology, Dr. Powell--05/11/13@11AM   Discharge Diagnoses:  Principal Problem:   Gastroenteritis Active Problems:   Acute renal failure   Thrombocytopenia   Hypertension   Coronary atherosclerosis of native coronary artery   SIRS (systemic inflammatory response syndrome)   Dehydration   Increased anion gap metabolic acidosis   Positive D dimer   Acute CHF   Acute diastolic CHF (congestive heart failure) Acute renal failure most likely due to ATN from septic and hypovolemic shock,  - Renal function improved today to 2.48  -Unclear baseline serum creatinine  - I/Os not accurate  - Nephrology consultation--> Improvement of renal function with stopping lasix.  - Low C3, but normal C4 and CH50  -Unclear what baseline creatinine is--04/06/2010 serum creatinine 1.12  Severe sepsis due to UTI/pyelonephritis due to E.coli  -04/25/2013 new fever--101.6F--> urine and blood culture, chest x-ray, start empiric vancomycin and Zosyn pending culture data--> discontinue ciprofloxacin during empiric treatment  -04/25/2013 chest x-ray shows interstitial edema without lobar consolidation  - UC from 8/17 - +  for E. Coli  - UC from 8/19 - NEG  - BC X 2 from 8/20: NEG  - UC from 8/20: NG  - Rocephin day 6 - stopped  -Original anticipated stop date of antibiotics--05/02/2013 which would Mark 14 days of antibiotic therapy for pyelonephritis  -Likely can be de-escalated to cipro if repeat cultures are unrevealing  -Unfortunately, the patient wanted to leave AGAINST MEDICAL ADVICE on 04/26/2013 -Prescription for ciprofloxacin 500 mg once daily, #7 was given to the patient which would finish 14 days of antibiotic therapy.  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 INR improved to 1.39 today. However his coagulopathy will likely continue to exist given his underlying liver cirrhosis  - Liver cirrhosis on abdominal US found.  - Hepatitis B surface antigen, hepatitis C antibody--neg  - ADAMTS13 pending but doubt HUS  -Peripheral smear negative for schistocytes  -UPEP--no Bence-Jones protein  -coags are improving  Acute diastolic CHF/CAD s/p CABG  - Lasix stopped8/22/14 after one dose, renal function improving.  - Echocardiogram--EF 55-60%, no wall motion abnormalities  - Repeat CXR 8/22 - improved aeration and decreased edema  -Will need to reconsult cardiology as the patient continues to be clinically volume overloaded in the face of marginal blood pressure and renal dysfunction  -Case was discussed with Dr. [name] prior to pt leaving AMA-recommended to d/c home with lasix 80bid until f/u at CHF clinic on 05/05/13@1020  Hypertension  - BP's continue to be soft--> hold home medications.  Increased anion  gap metabolic acidosis.  - AG is 10 today , resolved  -Likely resulting from acute on chronic renal failure  Hx of  bleeding ulcers/Anemia/positive FOBT  - PUD with GIB in March 2014  - On PPI  - Hgb stable over multiple days around 7.8  - consulted GI, no need for acute EGD at this time, unless active bleeding.  Positive D dimer.  - likely due to sepsis  - PE less likely as bilateral LE dopplers are negative (8/19)  - low probability of PE Anner Crete' score= 1.5)  -tachycardia has trended down with tx of sepsis  Antibiotics:  Flagyl 8/17 >>> 8/18  Rocephin 8/18 >>8/23  cipro 8/24>>> 04/25/13  vanco 04/25/13>>> 8/26 Zosyn 04/25/13>>>8/26   Discharge Condition: Stable  Disposition: Going home AGAINST MEDICAL ADVICE Follow-up Information   Follow up with [name]. (05/05/13@1020AM --Entrance C at [employer] code 0020)    Specialty:  Cardiology   Contact information:   [address] [id number] [place] [address] [phone]      Follow up with [name], MD. (05/11/13@11AM )    Specialty:  Nephrology   Contact information:   [address] [facility] [place] [address] [phone]     going home AMA  Diet:heart healthy Wt Readings from Last 3 Encounters:  04/26/13 104.645 kg (230 lb 11.2 oz)  03/22/13 106.142 kg (234 lb)  03/14/13 115.214 kg (254 lb)    History of present illness:  67 year old male patient with known CAD as well as prior upper GI bleed from peptic ulcer disease. Has chronic exertional shortness of breath not requiring home oxygen. Also has history of hypertension. Was in usual state of health until 3-4 days prior to presenting to the emergency department when he started experiencing profuse diarrhea. Because of the significant GI symptoms he called 911. Evaluation in the ER revealed the patient had acute renal failure with profound metabolic acidosis and severe hypotension. He was initially evaluated by critical care medicine but the patient stabilized enough that he did not require ICU  admission and was subsequently admitted by the Hospitalist service. He was found to have E. Coli sepsis/pyelonephritis. The patient was started on ceftriaxone.  Further evaluation revealed acute diastolic CHF. Cardiology was consulted due to his soft blood pressures and acute renal failure in the face of decompensated CHF. Nephrology was also consulted. Diuretics were deferred to her nephrology. The patient received one dose of furosemide 80 mg IV on 04/22/13. Diuretics have not been restarted. The patient's renal function has gradually improved to 2.48 (8/26), but the patient remains fluid overloaded. Cardiology has been reconsulted.  The patient was noted to be coagulopathic likely due to a combination of his underlying liver disease (presumed cirrhosis) as well as low grade DIC and sepsis. Hematology was consulted. His platelets and coagulopathy have gradually improved with treatment of his infectious process. On 04/25/2013, the patient spiked a temperature on 101.36F. Repeat blood cultures and urine cultures were obtained. The patient's antibiotics were empirically broadened On 04/25/2013. Unfortunately, the patient wanted to leave the hospital AGAINST MEDICAL ADVICE. Despite extensive conversation with the patient, he was unwilling to stay. The case was discussed with the heart failure team prior to the patient's discharge. They recommended the patient be discharged on furosemide 80 mg twice a day with followup in the heart failure clinic on September 4.     Consultants: Nephrology Cardiology CHF team Heme/Onc  Discharge Exam: Filed Vitals:   04/26/13  0600  BP: 107/42  Pulse: 85  Temp: 98.1 F (36.7 C)  Resp: 18   Filed Vitals:   04/25/13 1431 04/25/13 1900 04/25/13 2135 04/26/13 0600  BP: 116/51  120/42 107/42  Pulse: 85  91 85  Temp: 101.5 F (38.6 C) 98.6 F (37 C) 99.3 F (37.4 C) 98.1 F (36.7 C)  TempSrc: Oral Oral Oral Oral  Resp: 19  18 18   Height:      Weight:    104.645  kg (230 lb 11.2 oz)  SpO2: 100%  98% 100%   General: A&O x 3, NAD, pleasant, cooperative Cardiovascular: RRR, no rub, no gallop, no S3 Respiratory: Bibasilar crackles. No wheezes. Good air movement. Abdomen:soft, nontender, nondistended, positive bowel sounds Extremities: 3+LE edema, No lymphangitis, no petechiae  Discharge Instructions       Future Appointments Provider Department Dept Phone   05/05/2013 10:20 AM [facility] [phone]       Medication List    STOP taking these medications       hydrochlorothiazide 25 MG tablet  Commonly known as:  HYDRODIURIL      TAKE these medications       ALPRAZolam 0.25 MG tablet  Commonly known as:  XANAX  Take 1 tablet (0.25 mg total) by mouth at bedtime as needed for sleep or anxiety.     ciprofloxacin 500 MG tablet  Commonly known as:  CIPRO  Take 1 tablet (500 mg total) by mouth daily.     fish oil-omega-3 fatty acids 1000 MG capsule  Take 1 g by mouth daily.     furosemide 80 MG tablet  Commonly known as:  LASIX  Take 1 tablet (80 mg total) by mouth 2 (two) times daily.     HYDROcodone-acetaminophen 5-325 MG per tablet  Commonly known as:  NORCO/VICODIN  Take 1 tablet by mouth daily as needed for pain.     omeprazole 40 MG capsule  Commonly known as:  PRILOSEC  Take 40 mg by mouth daily as needed (for acid reflux).         The results of significant diagnostics from this hospitalization (including imaging, microbiology, ancillary and laboratory) are listed below for reference.    Significant Diagnostic Studies: Dg Chest 2 View  04/25/2013   *RADIOLOGY REPORT*  Clinical Data: Dyspnea, fever  CHEST - 2 VIEW  Comparison: April 22, 2013.  Findings: Cardiomediastinal silhouette appears normal.  Sternotomy wires are noted.  No pleural effusions or pneumothorax are noted. Moderate thoracic kyphosis is noted.  There is continued presence of mild central  pulmonary vascular congestion and bilateral interstitial densities which are unchanged.  IMPRESSION: Stable central pulmonary vascular congestion and pulmonary edema compared to prior exam.   Original Report Authenticated By: Lupita Raider.,  M.D.   Dg Chest 2 View  04/22/2013   *RADIOLOGY REPORT*  Clinical Data: CHF and dyspnea.  CHEST - 2 VIEW  Comparison: 04/19/2013  Findings: Improved aeration in both lungs.  Findings suggest decreased interstitial edema.  There are still coarse interstitial lung markings suggesting chronic changes or residual mild edema. Heart size is within normal limits.  Median sternotomy wires are present.  No significant pleural effusions. There are known compression deformities in the lower thoracic spine and upper lumbar spine.  IMPRESSION: Improving aeration of the lungs suggests decreasing pulmonary edema.   Original Report Authenticated By: Richarda Overlie, M.D.   US Abdomen Complete  04/22/2013   *RADIOLOGY REPORT*  Abdominal ultrasound  History:  Splenomegaly; thrombocytopenia  Comparison:  April 19, 2013  Findings:  Gallbladder is visualized in multiple projections. There are no gallstones, gallbladder wall thickening, or pericholecystic fluid collection.  There is no intrahepatic, common hepatic, common bile duct dilatation.  Pancreas is partially obscured by gas; visualized portions of pancreas appear normal.  The liver has a nodular contour with inhomogeneous echotexture consistent with underlying cirrhosis.  There is no well-defined liver mass on this study.  Spleen is enlarged.  Splenic volume measures 859 ml.  No focal splenic lesions are identified.  Kidneys bilaterally appear normal.  There is mild ascites surrounding the liver.  Aorta is nonaneurysmal.  Inferior vena cava appears patent.  Conclusion:  There is inhomogeneity to the echotexture of the liver, the liver has a nodular contour.  These changes consistent with cirrhosis.  While no focal liver masses are  identified, it must be cautioned that the sensitivity of ultrasound for mass lesions is diminished in this circumstance.  Spleen is enlarged.  There is mild ascites.  Pancreas is incompletely visualized due to gas.  Visualized portions of pancreas appear normal.  Study otherwise unremarkable.   Original Report Authenticated By: Bretta Bang, M.D.   US Renal  04/19/2013   *RADIOLOGY REPORT*  Clinical Data:  Acute renal injury  RENAL/URINARY TRACT ULTRASOUND COMPLETE  Comparison:  None  Findings:  Right Kidney:  Measures 10.7 cm.  Normal in size and parenchymal echogenicity.  No evidence of mass or hydronephrosis.  Left Kidney:  Measures 10.3 cm.Normal in size and parenchymal echogenicity.  No evidence of mass or hydronephrosis.  Bladder:  Appears normal for degree of bladder distention.  Other:  The spleen is enlarged measuring 18.5 cm in length.  The splenic volume equals 1228.3 ml.  IMPRESSION:  1.  Normal appearance of the kidneys. 2.  Splenomegaly.   Original Report Authenticated By: Signa Kell, M.D.   Dg Chest Port 1 View  04/19/2013   *RADIOLOGY REPORT*  Clinical Data: Shortness of breath.  Rales on physical exam.  PORTABLE CHEST - 1 VIEW  Comparison: 04/17/2013  Findings: Decreased lung volumes seen since prior exam.  Persistent cardiomegaly noted.  Increased diffuse interstitial infiltrates, suspicious for diffuse interstitial edema.  No focal consolidation identified.  Prior median sternotomy noted.  IMPRESSION: Cardiomegaly and increased diffuse interstitial infiltrates, consistent with congestive heart failure.   Original Report Authenticated By: Myles Rosenthal, M.D.   Dg Chest Port 1 View  04/17/2013   *RADIOLOGY REPORT*  Clinical Data: Dyspnea  PORTABLE CHEST - 1 VIEW  Comparison: Prior radiograph from 12/07/2012  Findings: Median sternotomy wires are again noted.  Cardiac and mediastinal silhouettes are within normal limits.  The lungs are normally inflated.  No focal infiltrate to suggest  an acute infectious pneumonitis is identified.  There is no pulmonary edema or pleural effusion.  No pneumothorax.  Bony thorax is intact.  IMPRESSION: No acute cardiopulmonary process.   Original Report Authenticated By: Rise Mu, M.D.     Microbiology: Recent Results (from the past 240 hour(s))  URINE CULTURE     Status: None   Collection Time    04/17/13  7:32 AM      Result Value Range Status   Specimen Description URINE, RANDOM   Final   Special Requests NONE   Final   Culture  Setup Time     Final   Value: 04/17/2013 08:48     Performed at [facility] Count  Final   Value: >=100,000 COLONIES/ML     Performed at [facility]   Culture     Final   Value: ESCHERICHIA COLI     Performed at [facility]   Report Status 04/19/2013 FINAL   Final   Organism ID, Bacteria ESCHERICHIA COLI   Final  MRSA PCR SCREENING     Status: None   Collection Time    04/17/13  9:47 AM      Result Value Range Status   MRSA by PCR NEGATIVE  NEGATIVE Final   Comment:            The GeneXpert MRSA Assay (FDA     approved for NASAL specimens     only), is one component of a     comprehensive MRSA colonization     surveillance program. It is not     intended to diagnose MRSA     infection nor to guide or     monitor treatment for     MRSA infections.  URINE CULTURE     Status: None   Collection Time    04/19/13  1:06 PM      Result Value Range Status   Specimen Description URINE, RANDOM   Final   Special Requests NONE   Final   Culture  Setup Time     Final   Value: 04/19/2013 22:50     Performed at [facility] Count     Final   Value: NO GROWTH     Performed at [facility]   Culture     Final   Value: NO GROWTH     Performed at [facility]   Report Status 04/20/2013 FINAL   Final  CULTURE, BLOOD (ROUTINE X 2)     Status: None   Collection Time    04/20/13  4:18 PM      Result Value Range Status    Specimen Description BLOOD RIGHT ARM   Final   Special Requests BOTTLES DRAWN AEROBIC ONLY 5CC   Final   Culture  Setup Time     Final   Value: 04/20/2013 21:17     Performed at [facility]   Culture     Final   Value: NO GROWTH 5 DAYS     Performed at [facility]   Report Status 04/26/2013 FINAL   Final  CULTURE, BLOOD (ROUTINE X 2)     Status: None   Collection Time    04/20/13  4:28 PM      Result Value Range Status   Specimen Description BLOOD LEFT ARM   Final   Special Requests BOTTLES DRAWN AEROBIC ONLY 8CC   Final   Culture  Setup Time     Final   Value: 04/20/2013 21:17     Performed at [facility]   Culture     Final   Value: NO GROWTH 5 DAYS     Performed at [facility]   Report Status 04/26/2013 FINAL   Final  URINE CULTURE     Status: None   Collection Time    04/20/13  5:01 PM      Result Value Range Status   Specimen Description URINE, CLEAN CATCH   Final   Special Requests NONE   Final   Culture  Setup Time     Final   Value: 04/21/2013 02:45     Performed at [facility]  Colony Count     Final   Value: NO GROWTH     Performed at [facility]   Culture     Final   Value: NO GROWTH     Performed at [facility]   Report Status 04/21/2013 FINAL   Final     Labs: Basic Metabolic Panel:  Recent Labs Lab 04/20/13 0610 04/21/13 0400 04/22/13 0620 04/23/13 0455 04/24/13 0555 04/26/13 0532  NA 138 136 139 134* 136 136  K 4.8 3.9 4.3 3.9 4.2 4.3  CL 105 103 104 100 103 106  CO2 21 20 24 22 23 21   GLUCOSE 94 93 86 96 96 86  BUN 81* 82* 85* 80* 67* 49*  CREATININE 3.98* 3.78* 3.97* 3.37* 3.12* 2.48*  CALCIUM 8.3* 8.2* 8.4 8.1* 8.7 8.4  PHOS  --  3.8 5.2*  --   --   --    Liver Function Tests:  Recent Labs Lab 04/21/13 0400 04/22/13 0620 04/23/13 0455  AST  --   --  30  ALT  --   --  18  ALKPHOS  --   --  153*  BILITOT  --   --  1.2  PROT  --   --  5.2*  ALBUMIN 2.0* 2.0* 1.8*    No results found for this basename: LIPASE, AMYLASE,  in the last 168 hours No results found for this basename: AMMONIA,  in the last 168 hours CBC:  Recent Labs Lab 04/21/13 0400 04/22/13 0620 04/23/13 0455 04/24/13 0555 04/26/13 0532  WBC 6.0 7.2 8.7 10.3 7.9  NEUTROABS  --   --   --  7.3  --   HGB 7.9* 7.8* 7.8* 7.9* 7.4*  HCT 22.2* 22.6* 21.6* 22.4* 22.1*  MCV 92.1 92.6 91.5 91.8 94.8  PLT 44* 50* 60* 65* 107*   Cardiac Enzymes: No results found for this basename: CKTOTAL, CKMB, CKMBINDEX, TROPONINI,  in the last 168 hours BNP: No components found with this basename: POCBNP,  CBG: No results found for this basename: GLUCAP,  in the last 168 hours  Time coordinating discharge:  Greater than 30 minutes  Signed:  Alfreida Steffenhagen, DO [facility] Pager: [phone] 04/26/2013, 2:05 PM

## 2013-04-27 LAB — IMMUNOFIXATION ELECTROPHORESIS
IgG (Immunoglobin G), Serum: 1540 mg/dL (ref 650–1600)
Total Protein ELP: 5.5 g/dL — ABNORMAL LOW (ref 6.0–8.3)

## 2013-05-02 LAB — CULTURE, BLOOD (ROUTINE X 2): Culture: NO GROWTH

## 2013-05-03 ENCOUNTER — Telehealth: Payer: Self-pay | Admitting: General Practice

## 2013-05-03 ENCOUNTER — Other Ambulatory Visit: Payer: Self-pay | Admitting: General Practice

## 2013-05-03 DIAGNOSIS — F411 Generalized anxiety disorder: Secondary | ICD-10-CM

## 2013-05-03 DIAGNOSIS — M25572 Pain in left ankle and joints of left foot: Secondary | ICD-10-CM

## 2013-05-03 MED ORDER — ALPRAZOLAM 0.25 MG PO TABS: 0.2500 mg | ORAL_TABLET | Freq: Every evening | ORAL | Status: DC | PRN

## 2013-05-03 MED ORDER — HYDROCODONE-ACETAMINOPHEN 5-325 MG PO TABS: 1.0000 | ORAL_TABLET | Freq: Every day | ORAL | Status: DC | PRN

## 2013-05-03 NOTE — Telephone Encounter (Signed)
Pt aware, rx's are ready.

## 2013-05-03 NOTE — Telephone Encounter (Signed)
Please inform patient that script is ready

## 2013-05-03 NOTE — Telephone Encounter (Signed)
Fill rx?

## 2013-05-05 ENCOUNTER — Ambulatory Visit (HOSPITAL_COMMUNITY): Payer: Medicare Other | Attending: Internal Medicine

## 2013-05-24 ENCOUNTER — Ambulatory Visit (HOSPITAL_COMMUNITY)
Admission: RE | Admit: 2013-05-24 | Discharge: 2013-05-24 | Disposition: A | Discharge status: Home / Self Care | Payer: Medicare Other | Source: Ambulatory Visit | Attending: Internal Medicine | Admitting: Internal Medicine

## 2013-05-24 VITALS — BP 112/68 | HR 88 | Wt 218.5 lb

## 2013-05-24 DIAGNOSIS — I10 Essential (primary) hypertension: Secondary | ICD-10-CM | POA: Insufficient documentation

## 2013-05-24 DIAGNOSIS — I5030 Unspecified diastolic (congestive) heart failure: Secondary | ICD-10-CM

## 2013-05-24 DIAGNOSIS — I5032 Chronic diastolic (congestive) heart failure: Secondary | ICD-10-CM

## 2013-05-24 DIAGNOSIS — R0989 Other specified symptoms and signs involving the circulatory and respiratory systems: Secondary | ICD-10-CM | POA: Insufficient documentation

## 2013-05-24 DIAGNOSIS — I2510 Atherosclerotic heart disease of native coronary artery without angina pectoris: Secondary | ICD-10-CM

## 2013-05-24 DIAGNOSIS — R0609 Other forms of dyspnea: Secondary | ICD-10-CM | POA: Insufficient documentation

## 2013-05-24 DIAGNOSIS — R0600 Dyspnea, unspecified: Secondary | ICD-10-CM

## 2013-05-24 NOTE — Progress Notes (Addendum)
Patient ID: Jay Mclean., male   DOB: 1946/03/04, 67 y.o.   MRN: [id number]  Weight Range   Baseline proBNP    HPI: 67 y/o male with h/o CAD s/p CABG in 1998. He was lost to f/u and over the years ("at least 7 or 8") he's had dyspnea on exertion along with chronic bilateral lower ext edema. He recently reestablished cardiology care in our [place] office with Junius Argyle, MD, and was placed on HCTZ (03/22/13). An echo and cardiolite were ordered however patient no-showed for both tests (I confirmed this with our [place] office).   Admitted 8/17-8/26 for profound diarrhea and dyspnea. He [facility] the [facility] ED where creatinine was elevated @ 3.05, D dimer was elevated @ 2.45, and he was tachycardic. He was anemic and thrombocytopenic with FOB + stool. ECG was non-acute and troponin was nl. UA was abnl and culture subsequently grew out E coli. He was aggressively hydrated and placed on abx for UTI/pyelonephritis. Renal Ultrasound - no hydropnephrosis. R and LLE negative for DVT. He was found to have A/C diastolic HF and was diuresed with 80 mg IV lasix. He left the hospital AMA and discharge weight 230.  ECHO (04/2013); EF 55-60%, LA mildly dilated  Post hospital follow up: Was admitted to the hospital 04/17/13-04/26/13 for diarrhea, ARF and hypotension. Feels much better. Denies SOB, CP, or orthopnea. + DOE can not walk all the way around [place] without stopping. Weight at home 218 lbs. Taking medications as prescribed and following low salt diet.  SH: Lives in an apt in [place]  ROS: All systems negative except as listed in HPI, PMH and Problem List.  Past Medical History  Diagnosis Date  . Hypertension   . Arthritis   . Coronary atherosclerosis of native coronary artery     a. s/p remote MI;  b. 10/1996 s/p CABG  . Acute kidney failure, unspecified   . Shortness of breath   . H/O hiatal hernia   . Bilateral lower extremity edema     a. chronic  . Hyperlipidemia   . Peptic ulcer disease    . GIB (gastrointestinal bleeding)     a. h/o GIB on nsaids/asa - 2009.Follows with GI in [place]   . Chronic pain     feet and ankles     Current Outpatient Prescriptions  Medication Sig Dispense Refill  . ALPRAZolam (XANAX) 0.25 MG tablet Take 1 tablet (0.25 mg total) by mouth at bedtime as needed for sleep or anxiety.  30 tablet  0  . fish oil-omega-3 fatty acids 1000 MG capsule Take 1 g by mouth daily.      . furosemide (LASIX) 80 MG tablet Take 1 tablet (80 mg total) by mouth 2 (two) times daily.  30 tablet  0  . HYDROcodone-acetaminophen (NORCO/VICODIN) 5-325 MG per tablet Take 1 tablet by mouth daily as needed for pain.  30 tablet  0  . omeprazole (PRILOSEC) 40 MG capsule Take 40 mg by mouth daily as needed (for acid reflux).        No current facility-administered medications for this encounter.    Filed Vitals:   05/24/13 1357  BP: 112/68  Pulse: 88  Weight: 218 lb 8 oz (99.111 kg)  SpO2: 98%    PHYSICAL EXAM: General: [place] appearing. No resp difficulty  HEENT: normal  Neck: supple. JVP 7 . Carotids 2+ bilat; no bruits. No lymphadenopathy or thryomegaly appreciated.  Cor: PMI nondisplaced. Regular rate & rhythm. No rubs,  gallops or murmurs.  Lungs: clear  Abdomen:obese, soft, nontender, nondistended. No hepatosplenomegaly. No bruits or masses. Good bowel sounds.  Extremities: no cyanosis, clubbing, rash, R and LLE 1+ edema  Neuro: alert & orientedx3, cranial nerves grossly intact. moves all 4 extremities w/o difficulty. Affect pleasant    ASSESSMENT & PLAN:   1) Chronic diastolic HF, EF 55-60% (04/2013) - NYHA II-III symptoms. His weight is down 12 lbs since discharge. CVP difficult to assess d/t body habitus but appears euvolmic. - Will check BMET and pro-BNP today with DOE. - Continue lasix 80 mg BID, may need K+ added - Discussed with patient the role of the HF clinic and gave him HF book with chart to write daily weights. Reinforced the need and  importance of daily weights, a low sodium diet, and fluid restriction (less than 2 L a day). Instructed to call the HF clinic if weight increases more than 3 lbs overnight or 5 lbs in a week.   2) CAD - no S/S of ischemia - has not followed up with cardiologist for years, however is now seeing Dr. Purvis Sheffield - need to discuss next visit about making sure he has follow up and someone is checking lipids on him not currently on statin.  - Not on ASA d/t history GI bleed  F/U 4-6 weeks  Ulla Potash B 10:11 PM

## 2013-05-24 NOTE — Patient Instructions (Addendum)
Doing great.  Will call with lab results.  Call if you notice any change in your symptoms such as weight gain or more short of breath.  Continue medications.  F/U 4-6 weeks  Do the following things EVERYDAY: 1) Weigh yourself in the morning before breakfast. Write it down and keep it in a log. 2) Take your medicines as prescribed 3) Eat low salt foods-Limit salt (sodium) to 2000 mg per day.  4) Stay as active as you can everyday 5) Limit all fluids for the day to less than 2 liters 6)

## 2013-05-25 DIAGNOSIS — I5030 Unspecified diastolic (congestive) heart failure: Secondary | ICD-10-CM | POA: Insufficient documentation

## 2013-05-27 ENCOUNTER — Other Ambulatory Visit: Payer: Self-pay

## 2013-05-27 MED ORDER — FUROSEMIDE 80 MG PO TABS: 80.0000 mg | ORAL_TABLET | Freq: Two times a day (BID) | ORAL | Status: DC

## 2013-05-31 ENCOUNTER — Other Ambulatory Visit: Payer: Self-pay | Admitting: General Practice

## 2013-05-31 DIAGNOSIS — M25572 Pain in left ankle and joints of left foot: Secondary | ICD-10-CM

## 2013-05-31 MED ORDER — HYDROCODONE-ACETAMINOPHEN 5-325 MG PO TABS: 1.0000 | ORAL_TABLET | Freq: Two times a day (BID) | ORAL | Status: AC | PRN

## 2013-05-31 NOTE — Progress Notes (Signed)
Discussed with patient that prescription for hydrocodone 5-325 was being written for 30 tablets, 1 tablet every 12 hours, prn pain, to help transition him into pain management clinic. I spoke with Dr. Laurian Brim this morning and he informed me that patient's pain medication would be written from pain management clinic starting in about 14 days. Patient is aware and verbalized understanding that once pain management clinic begins prescribing narcotics, he should only receive the medication from that clinic.

## 2013-06-23 ENCOUNTER — Ambulatory Visit (HOSPITAL_COMMUNITY)
Admission: RE | Admit: 2013-06-23 | Discharge: 2013-06-23 | Disposition: A | Discharge status: Home / Self Care | Payer: Medicare Other | Source: Ambulatory Visit | Attending: Internal Medicine | Admitting: Internal Medicine

## 2013-06-23 ENCOUNTER — Encounter (INDEPENDENT_AMBULATORY_CARE_PROVIDER_SITE_OTHER): Payer: Medicare Other

## 2013-06-23 ENCOUNTER — Encounter (HOSPITAL_COMMUNITY): Payer: Self-pay

## 2013-06-23 VITALS — BP 110/58 | HR 103 | Wt 216.3 lb

## 2013-06-23 DIAGNOSIS — I509 Heart failure, unspecified: Secondary | ICD-10-CM | POA: Insufficient documentation

## 2013-06-23 DIAGNOSIS — I2510 Atherosclerotic heart disease of native coronary artery without angina pectoris: Secondary | ICD-10-CM

## 2013-06-23 DIAGNOSIS — R0989 Other specified symptoms and signs involving the circulatory and respiratory systems: Secondary | ICD-10-CM

## 2013-06-23 DIAGNOSIS — N184 Chronic kidney disease, stage 4 (severe): Secondary | ICD-10-CM | POA: Insufficient documentation

## 2013-06-23 DIAGNOSIS — I5032 Chronic diastolic (congestive) heart failure: Secondary | ICD-10-CM | POA: Insufficient documentation

## 2013-06-23 DIAGNOSIS — I5031 Acute diastolic (congestive) heart failure: Secondary | ICD-10-CM

## 2013-06-23 DIAGNOSIS — I6529 Occlusion and stenosis of unspecified carotid artery: Secondary | ICD-10-CM

## 2013-06-23 LAB — BASIC METABOLIC PANEL
BUN: 16 mg/dL (ref 6–23)
Chloride: 100 mEq/L (ref 96–112)
Creatinine, Ser: 1.68 mg/dL — ABNORMAL HIGH (ref 0.50–1.35)
GFR calc Af Amer: 46 mL/min — ABNORMAL LOW (ref >90)
Glucose, Bld: 109 mg/dL — ABNORMAL HIGH (ref 70–99)

## 2013-06-23 MED ORDER — FUROSEMIDE 80 MG PO TABS: 80.0000 mg | ORAL_TABLET | Freq: Two times a day (BID) | ORAL | Status: AC

## 2013-06-23 MED ORDER — ATORVASTATIN CALCIUM 20 MG PO TABS: 20.0000 mg | ORAL_TABLET | Freq: Every day | ORAL | Status: AC

## 2013-06-23 NOTE — Progress Notes (Signed)
Patient ID: Jay Schroth., male   DOB: [birth year], 67 y.o.   MRN: [id number]   Weight Range   Baseline proBNP    HPI: 67 y/o male with h/o CAD s/p CABG in 1998. He was lost to f/u and over the years ("at least 7 or 8") he's had dyspnea on exertion along with chronic bilateral lower ext edema. He recently reestablished cardiology care in our [place] office with Junius Argyle, MD, and was placed on HCTZ (03/22/13). An echo and cardiolite were ordered however patient no-showed for both tests (I confirmed this with our [place] office).   Admitted 8/17-8/26 for profound diarrhea and dyspnea. He presented to the [facility] ED where creatinine was elevated @ 3.05, D dimer was elevated @ 2.45, and he was tachycardic. He was anemic and thrombocytopenic with FOB + stool. ECG was non-acute and troponin was nl. UA was abnl and culture subsequently grew out E coli. He was aggressively hydrated and placed on abx for UTI/pyelonephritis. Renal Ultrasound - no hydropnephrosis. R and LLE negative for DVT. He was found to have A/C diastolic HF and was diuresed with 80 mg IV lasix. He left the hospital AMA and discharge weight 230.  ECHO (04/2013); EF 55-60%, LA mildly dilated  Today, patient is doing well.  He can walk in [employer] without significant dyspnea.  He can climb a flight of steps.  No orthopnea/PND.  No chest pain.  Weight is down 2 lbs since last appointment. He has chronic foot pain (prior trauma with fractures) and uses hydrocodone.  This limits his walking.    SH: Lives in an apt in [place]. Nonsmoker.   ROS: All systems negative except as listed in HPI, PMH and Problem List.  Past Medical History  Diagnosis Date  . Hypertension   . Arthritis   . Coronary atherosclerosis of native coronary artery     a. s/p remote MI;  b. 10/1996 s/p CABG  . Acute kidney failure, unspecified   . Shortness of breath   . H/O hiatal hernia   . Bilateral lower extremity edema     a. chronic  . Hyperlipidemia   . Peptic  ulcer disease   . GIB (gastrointestinal bleeding)     a. h/o GIB on nsaids/asa - 2009.Follows with GI in [place]   . Chronic pain     feet and ankles     Current Outpatient Prescriptions  Medication Sig Dispense Refill  . fish oil-omega-3 fatty acids 1000 MG capsule Take 1 g by mouth daily.      . furosemide (LASIX) 80 MG tablet Take 1 tablet (80 mg total) by mouth 2 (two) times daily.  60 tablet  6  . HYDROcodone-acetaminophen (NORCO/VICODIN) 5-325 MG per tablet Take 1 tablet by mouth every 12 (twelve) hours as needed for pain.  30 tablet  0  . omeprazole (PRILOSEC) 40 MG capsule Take 40 mg by mouth daily as needed (for acid reflux).       Marland Kitchen atorvastatin (LIPITOR) 20 MG tablet Take 1 tablet (20 mg total) by mouth daily.  30 tablet  6   No current facility-administered medications for this encounter.    Filed Vitals:   06/23/13 0928  BP: 110/58  Pulse: 103  Weight: 216 lb 4.8 oz (98.113 kg)  SpO2: 100%    PHYSICAL EXAM: General: Eldery appearing. No resp difficulty  HEENT: normal  Neck: supple. JVP 7 . Carotids 2+ bilat; R carotid bruit. No lymphadenopathy or thryomegaly appreciated.  Cor:  PMI nondisplaced. Regular rate & rhythm. No rubs, gallops.  2/6 systolic murmur LLSB. Lungs: clear  Abdomen:obese, soft, nontender, nondistended. No hepatosplenomegaly. No bruits or masses. Good bowel sounds.  Extremities: no cyanosis, clubbing, rash, R and LLE 1+ edema  Neuro: alert & orientedx3, cranial nerves grossly intact. moves all 4 extremities w/o difficulty. Affect pleasant    ASSESSMENT & PLAN:   1) Chronic diastolic HF:  EF 16-10% (04/2013).  NYHA II symptoms. Weight is down another 2 lbs.  He looks close to euvolemic.  - BMET today.  - Continue lasix 80 mg BID 2) CAD: s/p CABG.  No chest pain.  He has had several GI bleeds in the past from PUD, he has bled on ASA 81 mg daily.  He is therefore not on ASA 81 daily.  He should be on a statin, so will start atorvastatin 20 mg  daily.  He will needs lipids/LFTs in 2 months.  3) Carotid bruit: I will arrange carotid dopplers.  4) CKD: BMET today.  He has a nephrologist.   F/U 3 months  Marca Ancona 12:02 PM

## 2013-06-23 NOTE — Patient Instructions (Signed)
Start Atorvastatin 20 mg daily  Labs today  Fasting labs in 2 months (mid December)  Your physician has requested that you have a carotid duplex. This test is an ultrasound of the carotid arteries in your neck. It looks at blood flow through these arteries that supply the brain with blood. Allow one hour for this exam. There are no restrictions or special instructions.  We will contact you in 3 months to schedule your next appointment.

## 2013-06-30 ENCOUNTER — Telehealth (HOSPITAL_COMMUNITY): Payer: Self-pay | Admitting: *Deleted

## 2013-06-30 DIAGNOSIS — I6523 Occlusion and stenosis of bilateral carotid arteries: Secondary | ICD-10-CM

## 2013-06-30 NOTE — Telephone Encounter (Signed)
Message copied by Noralee Space on Thu Jun 30, 2013  2:23 PM ------      Message from: Laurey Morale      Created: Wed Jun 29, 2013  8:18 AM       60-79% bilateral ICA stenosis, repeat dopplers 6 months. ------

## 2013-06-30 NOTE — Telephone Encounter (Signed)
Pt aware, order placed for 6 months, pt placed on recall list

## 2013-08-22 ENCOUNTER — Emergency Department (HOSPITAL_COMMUNITY): Payer: Medicare Other

## 2013-08-22 ENCOUNTER — Emergency Department (HOSPITAL_COMMUNITY)
Admission: EM | Admit: 2013-08-22 | Discharge: 2013-08-22 | Disposition: A | Discharge status: Home / Self Care | Payer: Medicare Other | Attending: Emergency Medicine | Admitting: Emergency Medicine

## 2013-08-22 ENCOUNTER — Encounter (HOSPITAL_COMMUNITY): Payer: Self-pay | Admitting: Emergency Medicine

## 2013-08-22 DIAGNOSIS — S3981XA Other specified injuries of abdomen, initial encounter: Secondary | ICD-10-CM | POA: Insufficient documentation

## 2013-08-22 DIAGNOSIS — I2510 Atherosclerotic heart disease of native coronary artery without angina pectoris: Secondary | ICD-10-CM | POA: Insufficient documentation

## 2013-08-22 DIAGNOSIS — Y929 Unspecified place or not applicable: Secondary | ICD-10-CM | POA: Insufficient documentation

## 2013-08-22 DIAGNOSIS — Y9389 Activity, other specified: Secondary | ICD-10-CM | POA: Insufficient documentation

## 2013-08-22 DIAGNOSIS — Z951 Presence of aortocoronary bypass graft: Secondary | ICD-10-CM | POA: Insufficient documentation

## 2013-08-22 DIAGNOSIS — S22009A Unspecified fracture of unspecified thoracic vertebra, initial encounter for closed fracture: Secondary | ICD-10-CM | POA: Insufficient documentation

## 2013-08-22 DIAGNOSIS — S32000A Wedge compression fracture of unspecified lumbar vertebra, initial encounter for closed fracture: Secondary | ICD-10-CM

## 2013-08-22 DIAGNOSIS — E785 Hyperlipidemia, unspecified: Secondary | ICD-10-CM | POA: Insufficient documentation

## 2013-08-22 DIAGNOSIS — M129 Arthropathy, unspecified: Secondary | ICD-10-CM | POA: Insufficient documentation

## 2013-08-22 DIAGNOSIS — X500XXA Overexertion from strenuous movement or load, initial encounter: Secondary | ICD-10-CM | POA: Insufficient documentation

## 2013-08-22 DIAGNOSIS — G8929 Other chronic pain: Secondary | ICD-10-CM | POA: Insufficient documentation

## 2013-08-22 DIAGNOSIS — I129 Hypertensive chronic kidney disease with stage 1 through stage 4 chronic kidney disease, or unspecified chronic kidney disease: Secondary | ICD-10-CM | POA: Insufficient documentation

## 2013-08-22 DIAGNOSIS — N189 Chronic kidney disease, unspecified: Secondary | ICD-10-CM | POA: Insufficient documentation

## 2013-08-22 DIAGNOSIS — S3210XA Unspecified fracture of sacrum, initial encounter for closed fracture: Secondary | ICD-10-CM | POA: Insufficient documentation

## 2013-08-22 DIAGNOSIS — Z87891 Personal history of nicotine dependence: Secondary | ICD-10-CM | POA: Insufficient documentation

## 2013-08-22 DIAGNOSIS — S22000A Wedge compression fracture of unspecified thoracic vertebra, initial encounter for closed fracture: Secondary | ICD-10-CM

## 2013-08-22 DIAGNOSIS — S32009A Unspecified fracture of unspecified lumbar vertebra, initial encounter for closed fracture: Secondary | ICD-10-CM | POA: Insufficient documentation

## 2013-08-22 DIAGNOSIS — Z79899 Other long term (current) drug therapy: Secondary | ICD-10-CM | POA: Insufficient documentation

## 2013-08-22 DIAGNOSIS — Z8719 Personal history of other diseases of the digestive system: Secondary | ICD-10-CM | POA: Insufficient documentation

## 2013-08-22 DIAGNOSIS — Z8711 Personal history of peptic ulcer disease: Secondary | ICD-10-CM | POA: Insufficient documentation

## 2013-08-22 LAB — CBC WITH DIFFERENTIAL/PLATELET
Basophils Absolute: 0 10*3/uL (ref 0.0–0.1)
Basophils Relative: 1 % (ref 0–1)
Eosinophils Absolute: 0.1 10*3/uL (ref 0.0–0.7)
Eosinophils Relative: 4 % (ref 0–5)
Hemoglobin: 9.8 g/dL — ABNORMAL LOW (ref 13.0–17.0)
Lymphocytes Relative: 26 % (ref 12–46)
MCHC: 33.2 g/dL (ref 30.0–36.0)
Monocytes Absolute: 0.3 10*3/uL (ref 0.1–1.0)
Monocytes Relative: 8 % (ref 3–12)
Neutro Abs: 2.1 10*3/uL (ref 1.7–7.7)
Neutrophils Relative %: 61 % (ref 43–77)
Platelets: 83 10*3/uL — ABNORMAL LOW (ref 150–400)
RDW: 21.1 % — ABNORMAL HIGH (ref 11.5–15.5)
WBC: 3.4 10*3/uL — ABNORMAL LOW (ref 4.0–10.5)

## 2013-08-22 LAB — BASIC METABOLIC PANEL
Creatinine, Ser: 1.67 mg/dL — ABNORMAL HIGH (ref 0.50–1.35)
GFR calc Af Amer: 47 mL/min — ABNORMAL LOW (ref >90)
GFR calc non Af Amer: 40 mL/min — ABNORMAL LOW (ref >90)
Potassium: 3.6 mEq/L (ref 3.5–5.1)
Sodium: 139 mEq/L (ref 135–145)

## 2013-08-22 LAB — URINALYSIS, ROUTINE W REFLEX MICROSCOPIC
Bilirubin Urine: NEGATIVE
Glucose, UA: NEGATIVE mg/dL
Hgb urine dipstick: NEGATIVE
Nitrite: NEGATIVE
Protein, ur: NEGATIVE mg/dL

## 2013-08-22 MED ORDER — OXYCODONE-ACETAMINOPHEN 5-325 MG PO TABS: 1.0000 | ORAL_TABLET | ORAL | Status: AC | PRN

## 2013-08-22 MED ORDER — OXYCODONE-ACETAMINOPHEN 5-325 MG PO TABS
1.0000 | ORAL_TABLET | Freq: Once | ORAL | Status: AC
  Administered 2013-08-22: 1 via ORAL
  Filled 2013-08-22: qty 1

## 2013-08-22 NOTE — ED Provider Notes (Signed)
  Face-to-face evaluation   History: Persistent, back pain, for 3 days since standing up from couch. No direct trauma. He has chronic back pain.  Physical exam: Alert, calm, cooperative. He is able to sit on the stretcher without significant pain. He has mild, palpable tenderness of the lumbar region  Medical screening examination/treatment/procedure(s) were conducted as a shared visit with non-physician practitioner(s) and myself.  I personally evaluated the patient during the encounter  Flint Melter, MD 08/22/13 2115

## 2013-08-22 NOTE — ED Provider Notes (Signed)
CSN: [id number]     Arrival date & time 08/22/13  1606 History   First MD Initiated Contact with Patient 08/22/13 1620     Chief Complaint  Patient presents with  . Back Pain   (Consider location/radiation/quality/duration/timing/severity/associated sxs/prior Treatment) HPI Comments: Jay Mclean. is a 67 y.o. Male presenting with rather sudden onset of worsened pain in his middle bilateral back.  He was lying down resting on his couch and when he stood up,  He suddenly had sharp pain which has been persistent and worsened with movement, particularly walking and when standing from a seated position.  He denies any increased shortness of breath or chest pain, but has noticed a more wet sounding cough for the past 2 days. He is treated for chronic kidney insufficiency along with CHF and has chronic swelling in his legs, but much improved since his recent hospitalization at [facility] at which time they changed his medications, resulting in a now 40 pound fluid weight loss.  He reports having Ct scans done during that hospitalization looking for an aneurysm, which was negative.  He was discovered though to have peptic ulcers which are being treated with prilosec.  He denies fevers, chills, dysuria, hematuria, urinary or fecal retention or incontinence, numbness or weakness in his legs.     The history is provided by the patient.    Past Medical History  Diagnosis Date  . Hypertension   . Arthritis   . Coronary atherosclerosis of native coronary artery     a. s/p remote MI;  b. 10/1996 s/p CABG  . Acute kidney failure, unspecified   . Shortness of breath   . H/O hiatal hernia   . Bilateral lower extremity edema     a. chronic  . Hyperlipidemia   . Peptic ulcer disease   . GIB (gastrointestinal bleeding)     a. h/o GIB on nsaids/asa - 2009.Follows with GI in [place]   . Chronic pain     feet and ankles    Past Surgical History  Procedure Laterality Date  . Foot surgery       s/p fall where fx'ed subtaler ankles b/l  . Coronary artery bypass graft  1998    x3  . Hiatal hernia surgery     Family History  Problem Relation Age of Onset  . Diabetes Father   . Renal Disease Father   . Heart attack Father     died @ 81  . Hip fracture Mother     died following hip fx @ 80.  . Other      siblings alive and well.   History  Substance Use Topics  . Smoking status: Former Smoker    Types: Cigarettes    Quit date: 09/01/1982  . Smokeless tobacco: Not on file  . Alcohol Use: No     Comment: previously drank a 6 pack of beer/day but quit in 1984.    Review of Systems  Constitutional: Negative for fever.  HENT: Negative for congestion and sore throat.   Eyes: Negative.   Respiratory: Negative for chest tightness and shortness of breath.   Cardiovascular: Negative for chest pain.  Gastrointestinal: Negative for nausea and abdominal pain.  Genitourinary: Positive for flank pain. Negative for dysuria, urgency and decreased urine volume.  Musculoskeletal: Positive for arthralgias and back pain. Negative for joint swelling and neck pain.  Skin: Negative.  Negative for rash and wound.  Neurological: Negative for dizziness, weakness, light-headedness, numbness and headaches.  Psychiatric/Behavioral: Negative.     Allergies  Ambien; Aspirin; and Ibuprofen  Home Medications   Current Outpatient Rx  Name  Route  Sig  Dispense  Refill  . atorvastatin (LIPITOR) 20 MG tablet   Oral   Take 1 tablet (20 mg total) by mouth daily.   30 tablet   6   . fish oil-omega-3 fatty acids 1000 MG capsule   Oral   Take 1 g by mouth daily.         . furosemide (LASIX) 80 MG tablet   Oral   Take 1 tablet (80 mg total) by mouth 2 (two) times daily.   60 tablet   6   . HYDROcodone-acetaminophen (NORCO/VICODIN) 5-325 MG per tablet   Oral   Take 1 tablet by mouth every 12 (twelve) hours as needed for pain.   30 tablet   0   . omeprazole (PRILOSEC) 40 MG  capsule   Oral   Take 40 mg by mouth daily as needed (for acid reflux).          Marland Kitchen oxyCODONE-acetaminophen (PERCOCET/ROXICET) 5-325 MG per tablet   Oral   Take 1 tablet by mouth every 4 (four) hours as needed for severe pain.   30 tablet   0    BP 113/49  Pulse 97  Temp(Src) 98.2 F (36.8 C) (Oral)  Resp 18  Ht 5\' 8"  (1.727 m)  Wt 210 lb (95.255 kg)  BMI 31.94 kg/m2  SpO2 98% Physical Exam  Nursing note and vitals reviewed. Constitutional: He appears well-developed and well-nourished.  HENT:  Head: Normocephalic.  Eyes: Conjunctivae are normal.  Neck: Normal range of motion. Neck supple.  Cardiovascular: Normal rate and intact distal pulses.   Pedal pulses normal.  Pulmonary/Chest: Effort normal.  Abdominal: Soft. Bowel sounds are normal. He exhibits no distension and no mass.  Musculoskeletal: Normal range of motion. He exhibits edema.       Lumbar back: He exhibits tenderness. He exhibits no swelling, no edema and no spasm.  1+ edema bilateral ankles.  No calf pain.  TTP across bilateral upper flanks, tender midline lower thoracic and upper lumbar.  Moderate kyphosis present.  Neurological: He is alert. He has normal strength. He displays no atrophy and no tremor. No sensory deficit. Gait normal.  Reflex Scores:      Patellar reflexes are 2+ on the right side and 2+ on the left side.      Achilles reflexes are 2+ on the right side and 2+ on the left side. No strength deficit noted in hip and knee flexor and extensor muscle groups.  Ankle flexion and extension intact.  Skin: Skin is warm and dry.  Psychiatric: He has a normal mood and affect.    ED Course  Procedures (including critical care time) Labs Review Labs Reviewed  CBC WITH DIFFERENTIAL - Abnormal; Notable for the following:    WBC 3.4 (*)    RBC 3.05 (*)    Hemoglobin 9.8 (*)    HCT 29.5 (*)    RDW 21.1 (*)    Platelets 83 (*)    All other components within normal limits  BASIC METABOLIC PANEL -  Abnormal; Notable for the following:    Glucose, Bld 105 (*)    BUN 30 (*)    Creatinine, Ser 1.67 (*)    GFR calc non Af Amer 40 (*)    GFR calc Af Amer 47 (*)    All other components within normal limits  URINALYSIS, ROUTINE W REFLEX MICROSCOPIC   Imaging Review Dg Chest 2 View  08/22/2013   CLINICAL DATA:  Cough and back pain  EXAM: CHEST  2 VIEW  COMPARISON:  07/31/2013  FINDINGS: Cardiac shadow is within normal limits. Postsurgical changes are again seen. A hiatal hernia is again noted. No focal infiltrate or sizable effusion is seen. Chronic compression deformities are again noted in the thoracic spine.  IMPRESSION: No acute abnormality noted.   Electronically Signed   By: Alcide Clever M.D.   On: 08/22/2013 17:28   Dg Lumbar Spine Complete  08/22/2013   CLINICAL DATA:  Back pain.  EXAM: LUMBAR SPINE - COMPLETE 4+ VIEW  COMPARISON:  CT abdomen 08/01/2013.  FINDINGS: Severe diffuse osteopenia and degenerative change present. Compression fractures are noted at T12, L1, and L2. These are unchanged. No prominent retropulsed fragments. Aortoiliac atherosclerotic vascular disease present.  IMPRESSION: Stable thoracolumbar compression fractures. No prominent retropulsed fragments. Severe degenerative changes.   Electronically Signed   By: Maisie Fus  Register   On: 08/22/2013 17:30    EKG Interpretation   None       MDM   1. Chronic back pain   2. Compression fracture of lumbosacral spine, closed, initial encounter   3. Compression fracture of thoracic vertebra, closed, initial encounter    Results including summery from hsopitalization 07/20/13 at [facility] along with Ct chest and abdomen obtained.  No aortic aneurysm. Multiple chronic thoracic and lumbar compression fractures.  Pt was seen by Dr Effie Shy during ed visit.  Pt advised discuss PT and/or kyphoplasty with pcp.  Compression fx are chronic.  Percocet prescribed.  He was given a tablet here with improvement in pain.    Burgess Amor, PA-C 08/22/13 1805

## 2013-08-22 NOTE — ED Notes (Signed)
Pt c/o pain in lower back x 3 days.  Denies injury.  Reports has had similar pain before but did not know the cause.

## 2013-09-01 DEATH — deceased

## 2013-12-15 ENCOUNTER — Encounter (HOSPITAL_COMMUNITY): Payer: Self-pay | Admitting: Cardiology

## 2014-01-15 IMAGING — CR DG CHEST 1V PORT
1 series · 1 of 1 positions shown · non-contrast
Comparison: Prior radiograph from 12/07/2012

CLINICAL DATA: Dyspnea

PORTABLE CHEST - 1 VIEW

[AP]
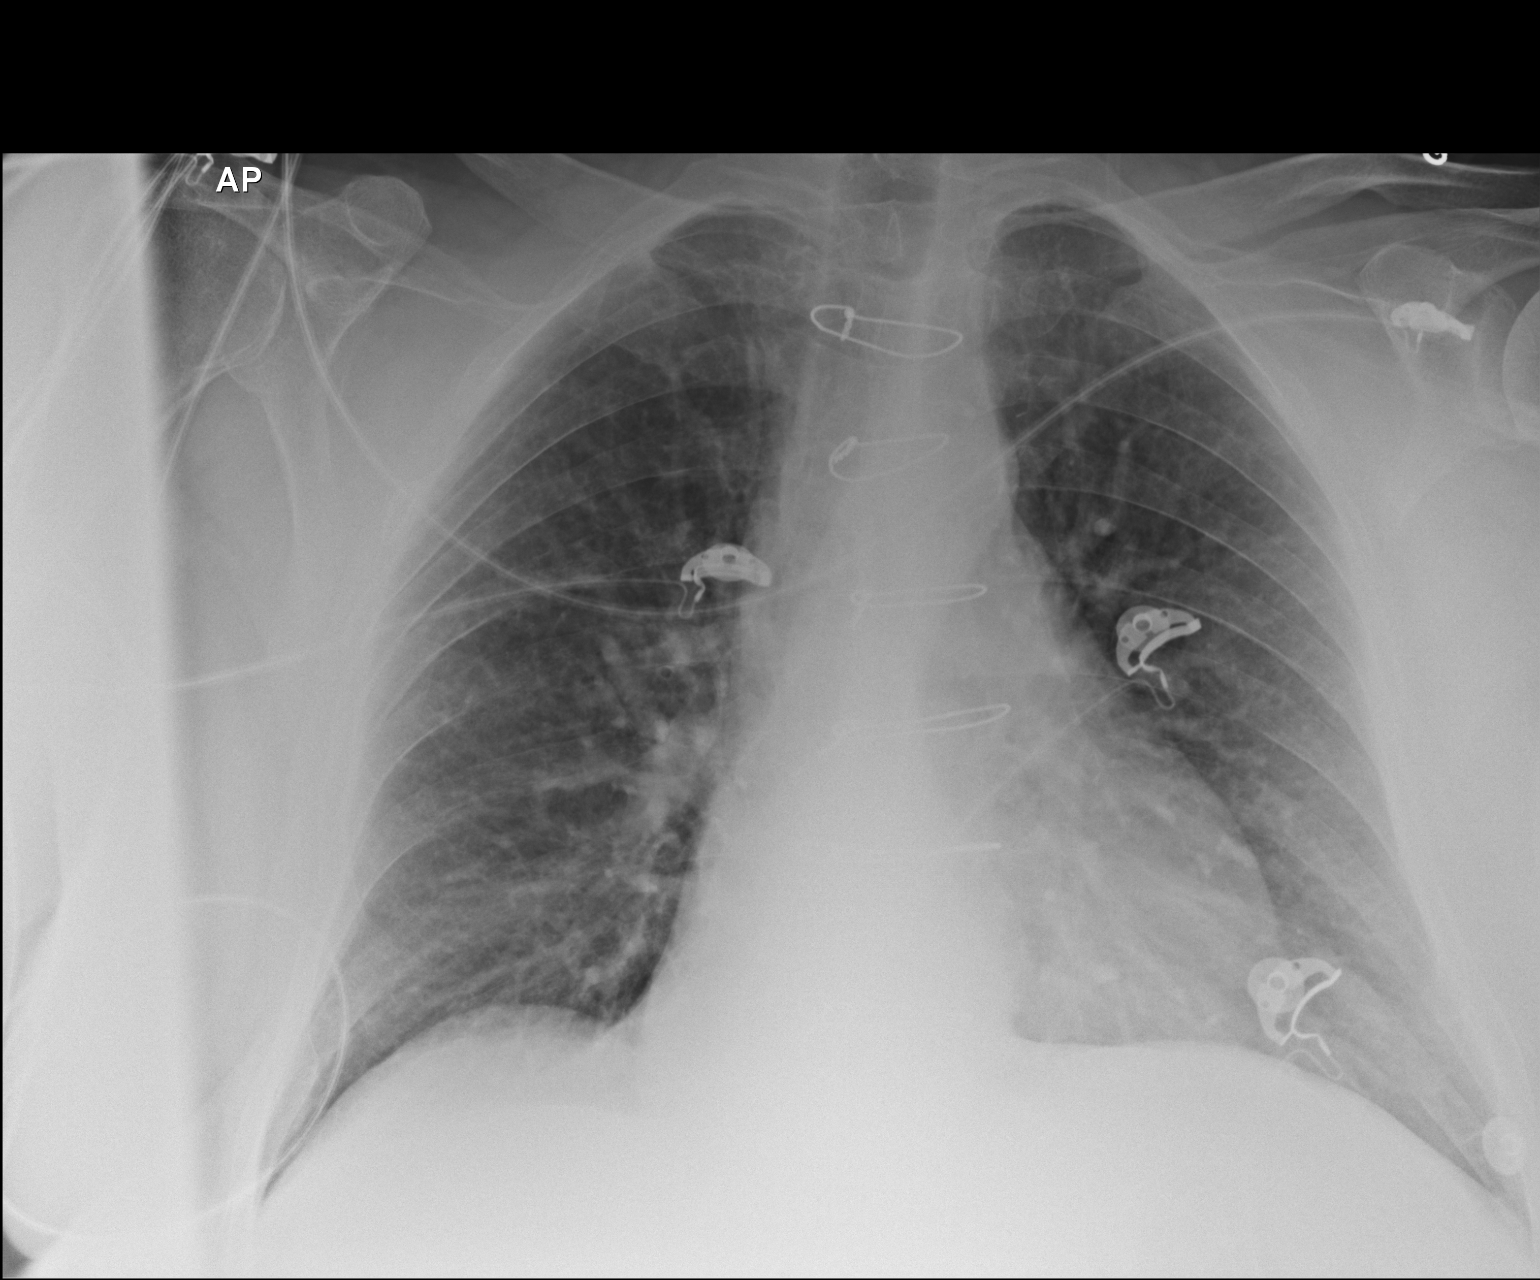

[1 of 1 positions shown; findings below may reference images not displayed]

FINDINGS: Median sternotomy wires are again noted.  Cardiac and
mediastinal silhouettes are within normal limits.

The lungs are normally inflated.  No focal infiltrate to suggest an
acute infectious pneumonitis is identified.  There is no pulmonary
edema or pleural effusion.  No pneumothorax.

Bony thorax is intact.
IMPRESSION: No acute cardiopulmonary process.

## 2014-01-17 IMAGING — CR DG CHEST 1V PORT
1 series · 1 of 1 positions shown · non-contrast
Comparison: 04/17/2013

CLINICAL DATA: Shortness of breath.  Rales on physical exam.

PORTABLE CHEST - 1 VIEW

[AP]
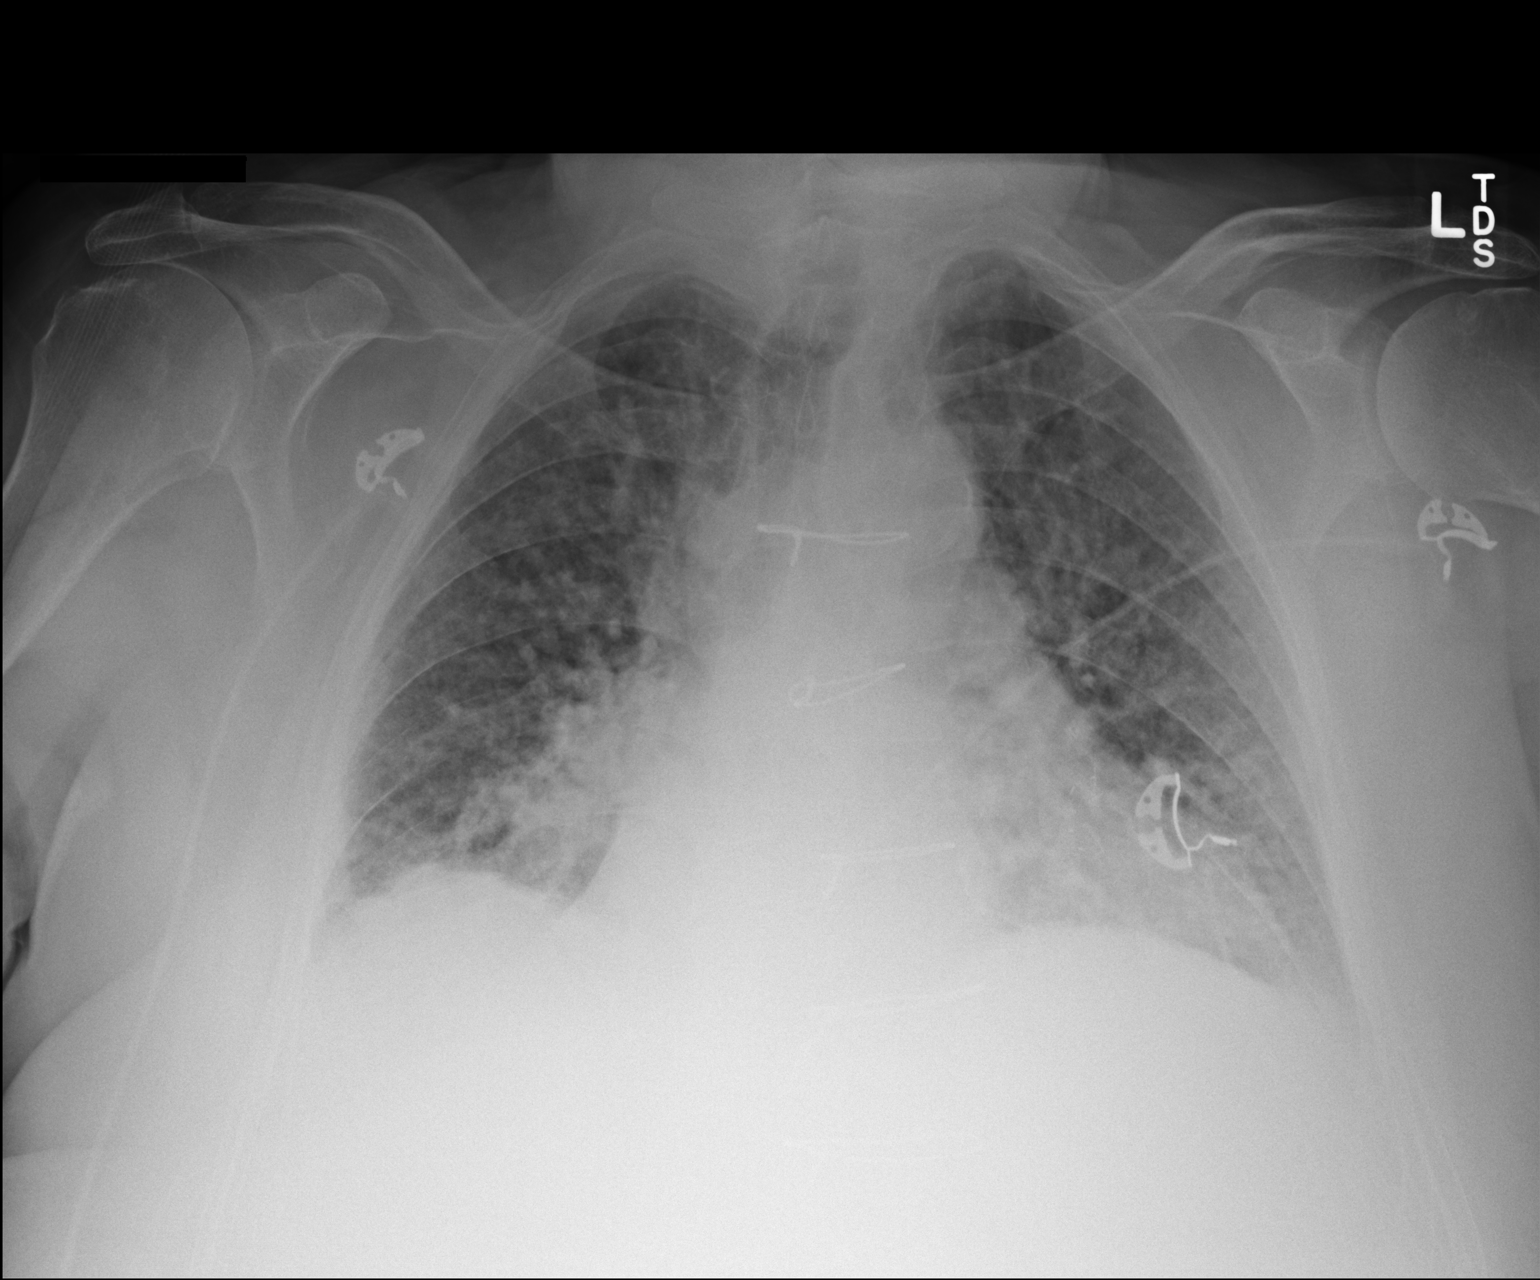

[1 of 1 positions shown; findings below may reference images not displayed]

FINDINGS: Decreased lung volumes seen since prior exam.  Persistent
cardiomegaly noted.  Increased diffuse interstitial infiltrates,
suspicious for diffuse interstitial edema.  No focal consolidation
identified.  Prior median sternotomy noted.
IMPRESSION: Cardiomegaly and increased diffuse interstitial infiltrates,
consistent with congestive heart failure.

## 2014-01-20 IMAGING — CR DG CHEST 2V
2 series · 2 of 2 positions shown · non-contrast
Comparison: 04/19/2013

CLINICAL DATA: CHF and dyspnea.

CHEST - 2 VIEW

[w chest pa]
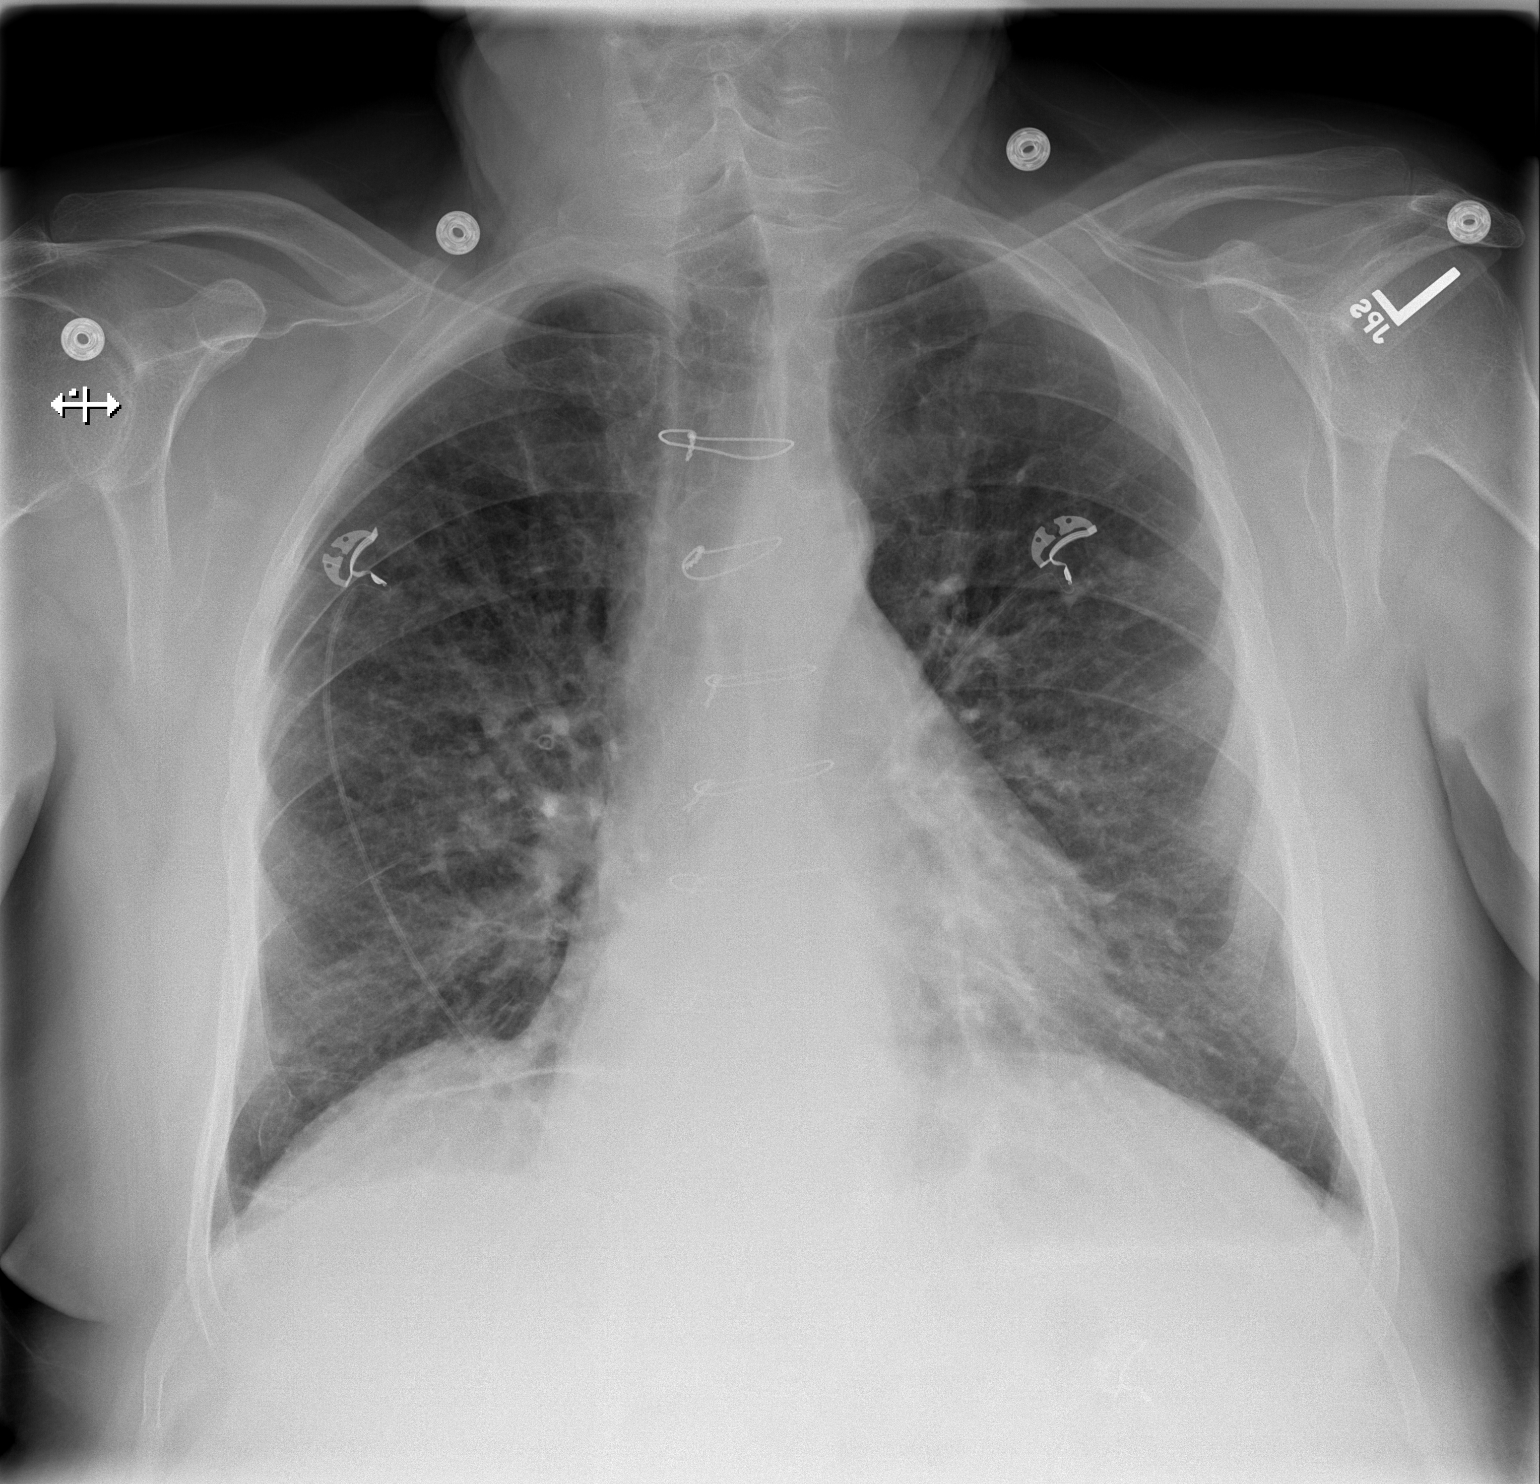

[w chest lat]
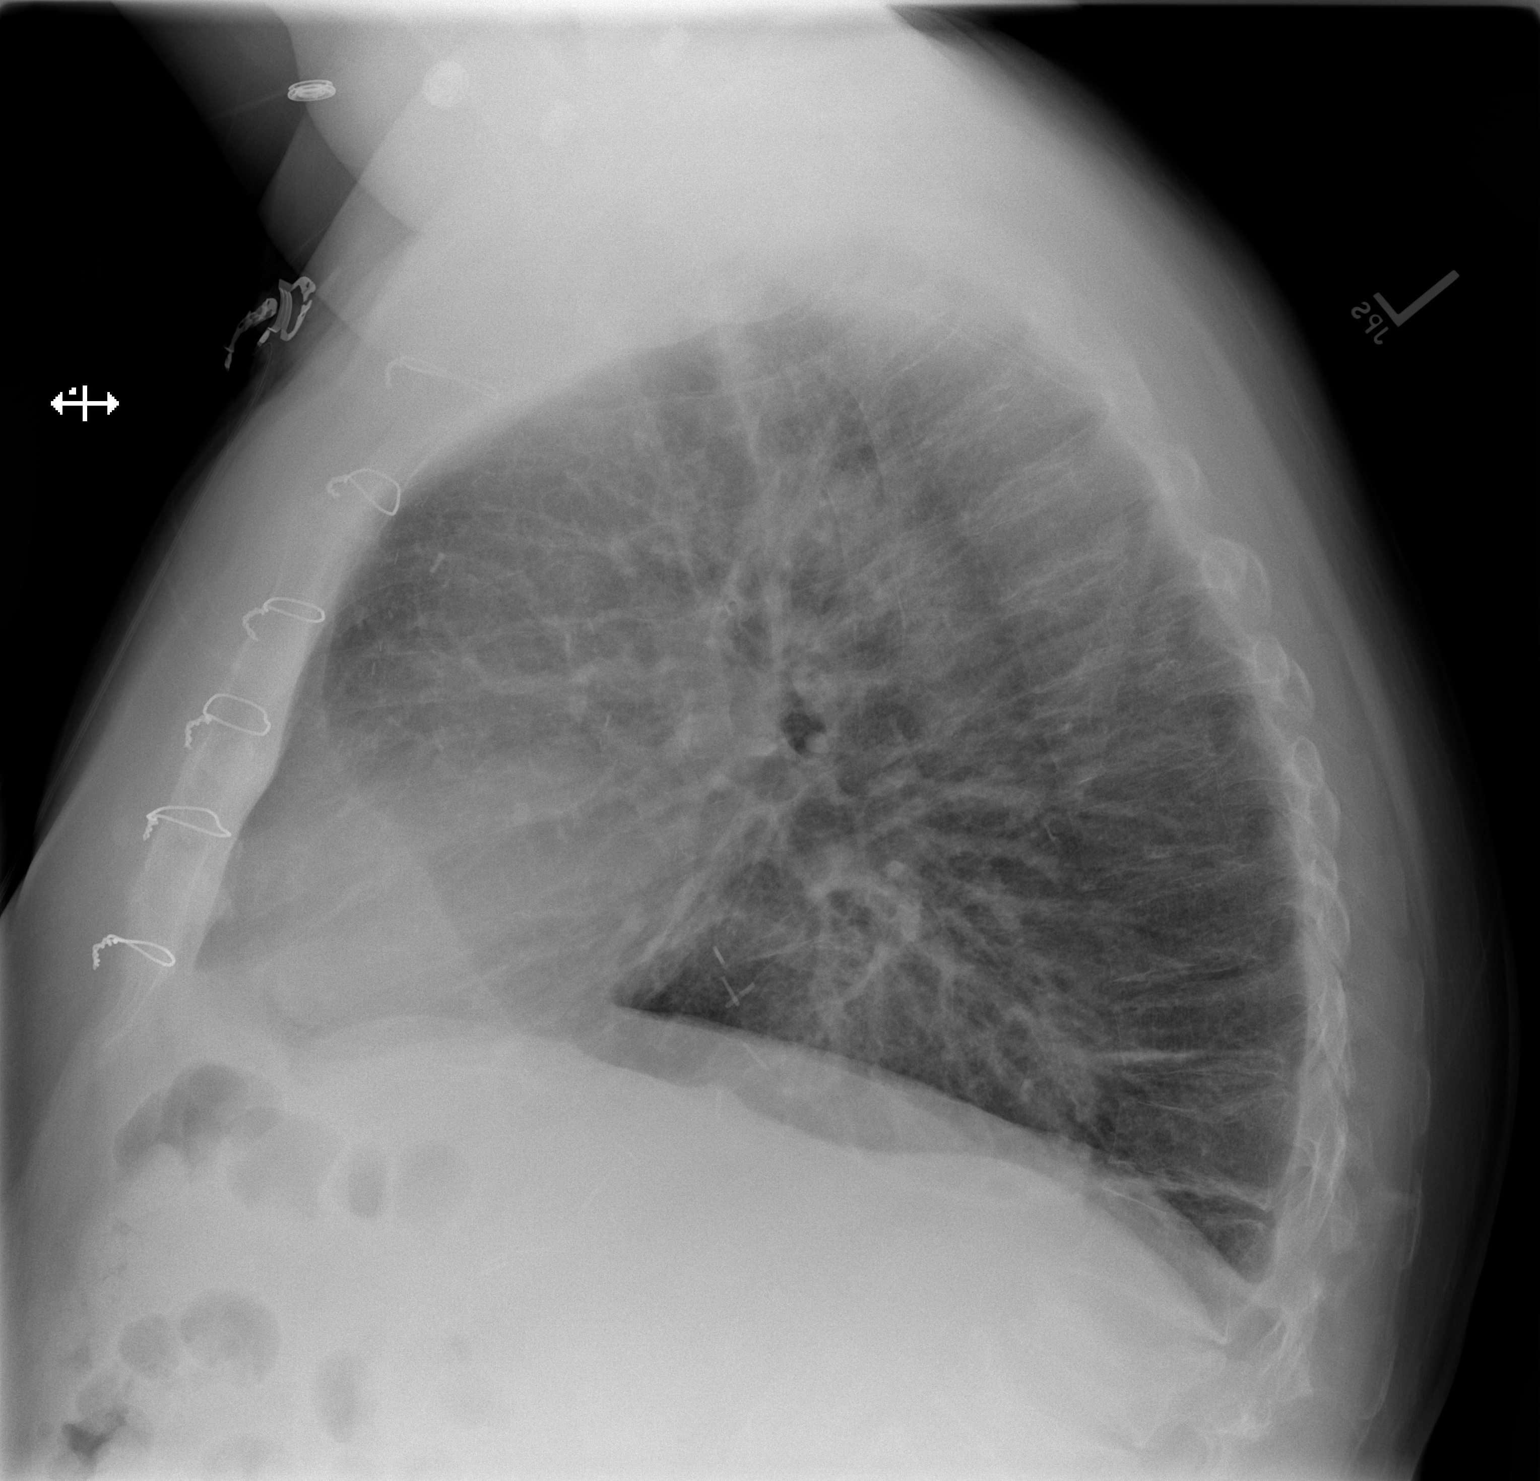

[2 of 2 positions shown; findings below may reference images not displayed]

FINDINGS: Improved aeration in both lungs.  Findings suggest
decreased interstitial edema.  There are still coarse interstitial
lung markings suggesting chronic changes or residual mild edema.
Heart size is within normal limits.  Median sternotomy wires are
present.  No significant pleural effusions. There are known
compression deformities in the lower thoracic spine and upper
lumbar spine.
IMPRESSION: Improving aeration of the lungs suggests decreasing pulmonary
edema.

## 2015-05-24 ENCOUNTER — Telehealth: Payer: Self-pay | Admitting: Cardiovascular Disease

## 2015-05-24 NOTE — Telephone Encounter (Signed)
Called telephone # was in system and was told that Jay Mclean does not live at this residence.
